# Patient Record
Sex: Female | Born: 2000 | Race: Black or African American | Hispanic: No | Marital: Single
Health system: Southern US, Community
[De-identification: ages and names within clinical notes are randomized; demographics above are authoritative.]

## PROBLEM LIST (undated history)

## (undated) ENCOUNTER — Ambulatory Visit: Admission: EM | Discharge status: Against Medical Advice | Payer: Medicaid Other

## (undated) DIAGNOSIS — A749 Chlamydial infection, unspecified: Secondary | ICD-10-CM

## (undated) DIAGNOSIS — Z789 Other specified health status: Secondary | ICD-10-CM

## (undated) HISTORY — DX: Other specified health status: Z78.9

## (undated) HISTORY — PX: NO PAST SURGERIES: SHX2092

## (undated) HISTORY — PX: SHOULDER SURGERY: SHX246

---
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
## 2019-04-15 ENCOUNTER — Encounter: Payer: Self-pay | Admitting: General Practice

## 2019-07-12 ENCOUNTER — Encounter (HOSPITAL_COMMUNITY): Payer: Self-pay

## 2019-07-12 ENCOUNTER — Emergency Department (HOSPITAL_COMMUNITY): Payer: Medicaid Other

## 2019-07-12 ENCOUNTER — Emergency Department (HOSPITAL_COMMUNITY)
Admission: EM | Admit: 2019-07-12 | Discharge: 2019-07-12 | Disposition: A | Discharge status: Home / Self Care | Payer: Medicaid Other | Attending: Emergency Medicine | Admitting: Emergency Medicine

## 2019-07-12 ENCOUNTER — Other Ambulatory Visit: Payer: Self-pay

## 2019-07-12 DIAGNOSIS — X509XXA Other and unspecified overexertion or strenuous movements or postures, initial encounter: Secondary | ICD-10-CM | POA: Diagnosis not present

## 2019-07-12 DIAGNOSIS — Y929 Unspecified place or not applicable: Secondary | ICD-10-CM | POA: Diagnosis not present

## 2019-07-12 DIAGNOSIS — M25519 Pain in unspecified shoulder: Secondary | ICD-10-CM | POA: Diagnosis not present

## 2019-07-12 DIAGNOSIS — S4991XA Unspecified injury of right shoulder and upper arm, initial encounter: Secondary | ICD-10-CM | POA: Diagnosis present

## 2019-07-12 DIAGNOSIS — S43014A Anterior dislocation of right humerus, initial encounter: Secondary | ICD-10-CM | POA: Diagnosis not present

## 2019-07-12 DIAGNOSIS — R52 Pain, unspecified: Secondary | ICD-10-CM | POA: Diagnosis not present

## 2019-07-12 DIAGNOSIS — Y999 Unspecified external cause status: Secondary | ICD-10-CM | POA: Insufficient documentation

## 2019-07-12 DIAGNOSIS — Y9389 Activity, other specified: Secondary | ICD-10-CM | POA: Insufficient documentation

## 2019-07-12 DIAGNOSIS — S43004A Unspecified dislocation of right shoulder joint, initial encounter: Secondary | ICD-10-CM | POA: Insufficient documentation

## 2019-07-12 MED ORDER — IBUPROFEN 400 MG PO TABS: 400.0000 mg | ORAL_TABLET | Freq: Three times a day (TID) | ORAL | 0 refills | Status: DC

## 2019-07-12 MED ORDER — IBUPROFEN 400 MG PO TABS: 400.0000 mg | ORAL_TABLET | Freq: Four times a day (QID) | ORAL | 0 refills | Status: DC | PRN

## 2019-07-12 MED ORDER — KETAMINE HCL 50 MG/5ML IJ SOSY
0.3000 mg/kg | PREFILLED_SYRINGE | Freq: Once | INTRAMUSCULAR | Status: AC
  Administered 2019-07-12: 17 mg via INTRAVENOUS
  Filled 2019-07-12: qty 5

## 2019-07-12 MED ORDER — KETAMINE HCL 50 MG/5ML IJ SOSY: 1.0000 mg/kg | PREFILLED_SYRINGE | Freq: Once | INTRAMUSCULAR | Status: DC

## 2019-07-12 MED ORDER — ETOMIDATE 2 MG/ML IV SOLN
8.0000 mg | Freq: Once | INTRAVENOUS | Status: AC
  Administered 2019-07-12: 19:00:00 8 mg via INTRAVENOUS
  Filled 2019-07-12: qty 10
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
## 2019-07-12 NOTE — Progress Notes (Signed)
This Probation officer called to bedside for conscious sedation procedure.  Pt tolerated well.  HR108, spo2 100% on room air.  ETCO2=39.  RN remains in room.
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
## 2019-08-18 ENCOUNTER — Emergency Department (HOSPITAL_COMMUNITY): Payer: Medicaid Other

## 2019-08-18 ENCOUNTER — Inpatient Hospital Stay (HOSPITAL_COMMUNITY)
Admission: EM | Admit: 2019-08-18 | Discharge: 2019-08-23 | DRG: 208 | Disposition: A | Discharge status: Home / Self Care | Payer: Medicaid Other | Attending: Surgery | Admitting: Surgery

## 2019-08-18 DIAGNOSIS — D62 Acute posthemorrhagic anemia: Secondary | ICD-10-CM | POA: Diagnosis present

## 2019-08-18 DIAGNOSIS — S21232A Puncture wound without foreign body of left back wall of thorax without penetration into thoracic cavity, initial encounter: Secondary | ICD-10-CM | POA: Diagnosis present

## 2019-08-18 DIAGNOSIS — R52 Pain, unspecified: Secondary | ICD-10-CM | POA: Diagnosis not present

## 2019-08-18 DIAGNOSIS — I472 Ventricular tachycardia: Secondary | ICD-10-CM | POA: Diagnosis present

## 2019-08-18 DIAGNOSIS — J90 Pleural effusion, not elsewhere classified: Secondary | ICD-10-CM | POA: Diagnosis present

## 2019-08-18 DIAGNOSIS — Z20822 Contact with and (suspected) exposure to covid-19: Secondary | ICD-10-CM | POA: Diagnosis present

## 2019-08-18 DIAGNOSIS — S42112B Displaced fracture of body of scapula, left shoulder, initial encounter for open fracture: Secondary | ICD-10-CM | POA: Diagnosis not present

## 2019-08-18 DIAGNOSIS — T859XXD Unspecified complication of internal prosthetic device, implant and graft, subsequent encounter: Secondary | ICD-10-CM

## 2019-08-18 DIAGNOSIS — S272XXA Traumatic hemopneumothorax, initial encounter: Principal | ICD-10-CM | POA: Diagnosis present

## 2019-08-18 DIAGNOSIS — J939 Pneumothorax, unspecified: Secondary | ICD-10-CM

## 2019-08-18 DIAGNOSIS — J9600 Acute respiratory failure, unspecified whether with hypoxia or hypercapnia: Secondary | ICD-10-CM

## 2019-08-18 DIAGNOSIS — S31609A Unspecified open wound of abdominal wall, unspecified quadrant with penetration into peritoneal cavity, initial encounter: Secondary | ICD-10-CM | POA: Diagnosis not present

## 2019-08-18 DIAGNOSIS — S21109A Unspecified open wound of unspecified front wall of thorax without penetration into thoracic cavity, initial encounter: Secondary | ICD-10-CM | POA: Diagnosis not present

## 2019-08-18 DIAGNOSIS — W3400XA Accidental discharge from unspecified firearms or gun, initial encounter: Secondary | ICD-10-CM

## 2019-08-18 DIAGNOSIS — R531 Weakness: Secondary | ICD-10-CM | POA: Diagnosis not present

## 2019-08-18 DIAGNOSIS — S42112A Displaced fracture of body of scapula, left shoulder, initial encounter for closed fracture: Secondary | ICD-10-CM | POA: Diagnosis present

## 2019-08-18 DIAGNOSIS — J9601 Acute respiratory failure with hypoxia: Secondary | ICD-10-CM | POA: Diagnosis present

## 2019-08-18 DIAGNOSIS — J9690 Respiratory failure, unspecified, unspecified whether with hypoxia or hypercapnia: Secondary | ICD-10-CM

## 2019-08-18 DIAGNOSIS — S31109A Unspecified open wound of abdominal wall, unspecified quadrant without penetration into peritoneal cavity, initial encounter: Secondary | ICD-10-CM | POA: Diagnosis not present

## 2019-08-18 DIAGNOSIS — R000 Tachycardia, unspecified: Secondary | ICD-10-CM | POA: Diagnosis not present

## 2019-08-18 DIAGNOSIS — R404 Transient alteration of awareness: Secondary | ICD-10-CM | POA: Diagnosis not present

## 2019-08-18 DIAGNOSIS — S31000A Unspecified open wound of lower back and pelvis without penetration into retroperitoneum, initial encounter: Secondary | ICD-10-CM | POA: Diagnosis not present

## 2019-08-18 DIAGNOSIS — E876 Hypokalemia: Secondary | ICD-10-CM | POA: Diagnosis present

## 2019-08-18 DIAGNOSIS — S2242XA Multiple fractures of ribs, left side, initial encounter for closed fracture: Secondary | ICD-10-CM | POA: Diagnosis not present

## 2019-08-18 DIAGNOSIS — I959 Hypotension, unspecified: Secondary | ICD-10-CM | POA: Diagnosis present

## 2019-08-18 DIAGNOSIS — R110 Nausea: Secondary | ICD-10-CM | POA: Diagnosis not present

## 2019-08-18 DIAGNOSIS — I10 Essential (primary) hypertension: Secondary | ICD-10-CM | POA: Diagnosis not present

## 2019-08-18 DIAGNOSIS — J9382 Other air leak: Secondary | ICD-10-CM | POA: Diagnosis present

## 2019-08-18 DIAGNOSIS — S21132A Puncture wound without foreign body of left front wall of thorax without penetration into thoracic cavity, initial encounter: Secondary | ICD-10-CM | POA: Diagnosis present

## 2019-08-18 DIAGNOSIS — Z23 Encounter for immunization: Secondary | ICD-10-CM

## 2019-08-18 HISTORY — DX: Accidental discharge from unspecified firearms or gun, initial encounter: W34.00XA

## 2019-08-18 LAB — CBC
HCT: 31.9 % — ABNORMAL LOW (ref 36.0–46.0)
Hemoglobin: 9.8 g/dL — ABNORMAL LOW (ref 12.0–15.0)
MCH: 29.9 pg (ref 26.0–34.0)
MCHC: 30.7 g/dL (ref 30.0–36.0)
MCV: 97.3 fL (ref 80.0–100.0)
Platelets: 323 10*3/uL (ref 150–400)
RBC: 3.28 MIL/uL — ABNORMAL LOW (ref 3.87–5.11)
RDW: 12 % (ref 11.5–15.5)
WBC: 7.7 10*3/uL (ref 4.0–10.5)
nRBC: 0 % (ref 0.0–0.2)

## 2019-08-18 LAB — PROTIME-INR
INR: 1.3 — ABNORMAL HIGH (ref 0.8–1.2)
Prothrombin Time: 15.6 seconds — ABNORMAL HIGH (ref 11.4–15.2)

## 2019-08-18 LAB — POC SARS CORONAVIRUS 2 AG -  ED: SARS Coronavirus 2 Ag: NEGATIVE

## 2019-08-18 MED ORDER — FENTANYL CITRATE (PF) 100 MCG/2ML IJ SOLN
INTRAMUSCULAR | Status: AC | PRN
  Administered 2019-08-18: 100 ug via INTRAVENOUS

## 2019-08-18 MED ORDER — MIDAZOLAM HCL 2 MG/2ML IJ SOLN
INTRAMUSCULAR | Status: AC
  Filled 2019-08-18: qty 2

## 2019-08-18 MED ORDER — SODIUM CHLORIDE 0.9 % IV SOLN
INTRAVENOUS | Status: AC | PRN
  Administered 2019-08-18: 1000 mL via INTRAVENOUS

## 2019-08-18 MED ORDER — SUCCINYLCHOLINE CHLORIDE 20 MG/ML IJ SOLN
INTRAMUSCULAR | Status: AC | PRN
  Administered 2019-08-18: 150 mg via INTRAVENOUS

## 2019-08-18 MED ORDER — ETOMIDATE 2 MG/ML IV SOLN
INTRAVENOUS | Status: AC | PRN
  Administered 2019-08-18: 15 mg via INTRAVENOUS

## 2019-08-18 MED ORDER — PROPOFOL 1000 MG/100ML IV EMUL
INTRAVENOUS | Status: AC
  Administered 2019-08-19: 10 ug/kg/min
  Filled 2019-08-18: qty 100

## 2019-08-18 MED ORDER — FENTANYL CITRATE (PF) 100 MCG/2ML IJ SOLN
INTRAMUSCULAR | Status: AC
  Filled 2019-08-18: qty 2

## 2019-08-18 MED ORDER — MIDAZOLAM HCL 5 MG/5ML IJ SOLN
INTRAMUSCULAR | Status: AC | PRN
  Administered 2019-08-18: 2 mg via INTRAVENOUS

## 2019-08-18 NOTE — ED Provider Notes (Signed)
Procedure Name: Intubation Date/Time: 08/18/2019 11:38 PM Performed by: Era Bumpers, MD Pre-anesthesia Checklist: Patient identified, Emergency Drugs available, Suction available, Patient being monitored and Timeout performed Preoxygenation: Pre-oxygenation with 100% oxygen Induction Type: IV induction and Rapid sequence Laryngoscope Size: Mac and 4 Grade View: Grade II Tube size: 7.5 mm Number of attempts: 1 Airway Equipment and Method: Video-laryngoscopy Placement Confirmation: ETT inserted through vocal cords under direct vision,  Breath sounds checked- equal and bilateral and CO2 detector Secured at: 23 cm Tube secured with: ETT holder      Tolerated well with no immediate complications Cxr demonstrates tube in appropriate position   Marytza Grandpre, Lovena Le, MD 08/18/19 0272    Merryl Hacker, MD 08/19/19 [phone]

## 2019-08-18 NOTE — Procedures (Signed)
.  Chest Tube Insertion Procedure Note  Indications:  Clinically significant Hemothorax  Pre-operative Diagnosis: Hemothorax  Post-operative Diagnosis: Hemothorax  Procedure Details  Informed consent was obtained for the procedure, including sedation.  Risks of lung perforation, hemorrhage, arrhythmia, and adverse drug reaction were discussed.   After sterile skin prep, using standard technique, a 20 French tube was placed in the left lateral 5th rib space.  Findings: 100-200 ml blood out initially  Estimated Blood Loss:  200 mL         Specimens:  None              Complications:  None; patient tolerated the procedure well.         Disposition: ICU - intubated and critically ill.         Condition: stable
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
## 2019-08-19 ENCOUNTER — Inpatient Hospital Stay (HOSPITAL_COMMUNITY): Payer: Medicaid Other

## 2019-08-19 ENCOUNTER — Emergency Department (HOSPITAL_COMMUNITY): Payer: Medicaid Other

## 2019-08-19 ENCOUNTER — Encounter (HOSPITAL_COMMUNITY): Payer: Self-pay

## 2019-08-19 DIAGNOSIS — S21402A Unspecified open wound of left back wall of thorax with penetration into thoracic cavity, initial encounter: Secondary | ICD-10-CM | POA: Diagnosis not present

## 2019-08-19 DIAGNOSIS — R110 Nausea: Secondary | ICD-10-CM | POA: Diagnosis not present

## 2019-08-19 DIAGNOSIS — Z20822 Contact with and (suspected) exposure to covid-19: Secondary | ICD-10-CM | POA: Diagnosis present

## 2019-08-19 DIAGNOSIS — S21102A Unspecified open wound of left front wall of thorax without penetration into thoracic cavity, initial encounter: Secondary | ICD-10-CM | POA: Diagnosis not present

## 2019-08-19 DIAGNOSIS — R000 Tachycardia, unspecified: Secondary | ICD-10-CM | POA: Diagnosis not present

## 2019-08-19 DIAGNOSIS — S2242XA Multiple fractures of ribs, left side, initial encounter for closed fracture: Secondary | ICD-10-CM | POA: Diagnosis not present

## 2019-08-19 DIAGNOSIS — W3400XA Accidental discharge from unspecified firearms or gun, initial encounter: Secondary | ICD-10-CM

## 2019-08-19 DIAGNOSIS — S31000A Unspecified open wound of lower back and pelvis without penetration into retroperitoneum, initial encounter: Secondary | ICD-10-CM | POA: Diagnosis not present

## 2019-08-19 DIAGNOSIS — J9601 Acute respiratory failure with hypoxia: Secondary | ICD-10-CM | POA: Diagnosis present

## 2019-08-19 DIAGNOSIS — J9382 Other air leak: Secondary | ICD-10-CM | POA: Diagnosis present

## 2019-08-19 DIAGNOSIS — S31109A Unspecified open wound of abdominal wall, unspecified quadrant without penetration into peritoneal cavity, initial encounter: Secondary | ICD-10-CM | POA: Diagnosis not present

## 2019-08-19 DIAGNOSIS — Z23 Encounter for immunization: Secondary | ICD-10-CM | POA: Diagnosis not present

## 2019-08-19 DIAGNOSIS — J939 Pneumothorax, unspecified: Secondary | ICD-10-CM | POA: Diagnosis not present

## 2019-08-19 DIAGNOSIS — I472 Ventricular tachycardia: Secondary | ICD-10-CM | POA: Diagnosis present

## 2019-08-19 DIAGNOSIS — I959 Hypotension, unspecified: Secondary | ICD-10-CM | POA: Diagnosis present

## 2019-08-19 DIAGNOSIS — E876 Hypokalemia: Secondary | ICD-10-CM | POA: Diagnosis present

## 2019-08-19 DIAGNOSIS — S31609A Unspecified open wound of abdominal wall, unspecified quadrant with penetration into peritoneal cavity, initial encounter: Secondary | ICD-10-CM | POA: Diagnosis not present

## 2019-08-19 DIAGNOSIS — Z4682 Encounter for fitting and adjustment of non-vascular catheter: Secondary | ICD-10-CM | POA: Diagnosis not present

## 2019-08-19 DIAGNOSIS — J90 Pleural effusion, not elsewhere classified: Secondary | ICD-10-CM | POA: Diagnosis present

## 2019-08-19 DIAGNOSIS — S42112B Displaced fracture of body of scapula, left shoulder, initial encounter for open fracture: Secondary | ICD-10-CM | POA: Diagnosis not present

## 2019-08-19 DIAGNOSIS — S42112A Displaced fracture of body of scapula, left shoulder, initial encounter for closed fracture: Secondary | ICD-10-CM | POA: Diagnosis present

## 2019-08-19 DIAGNOSIS — S21132A Puncture wound without foreign body of left front wall of thorax without penetration into thoracic cavity, initial encounter: Secondary | ICD-10-CM | POA: Diagnosis present

## 2019-08-19 DIAGNOSIS — D62 Acute posthemorrhagic anemia: Secondary | ICD-10-CM | POA: Diagnosis not present

## 2019-08-19 DIAGNOSIS — S21109A Unspecified open wound of unspecified front wall of thorax without penetration into thoracic cavity, initial encounter: Secondary | ICD-10-CM | POA: Diagnosis not present

## 2019-08-19 DIAGNOSIS — S21232A Puncture wound without foreign body of left back wall of thorax without penetration into thoracic cavity, initial encounter: Secondary | ICD-10-CM | POA: Diagnosis present

## 2019-08-19 DIAGNOSIS — R2689 Other abnormalities of gait and mobility: Secondary | ICD-10-CM | POA: Diagnosis not present

## 2019-08-19 DIAGNOSIS — J9600 Acute respiratory failure, unspecified whether with hypoxia or hypercapnia: Secondary | ICD-10-CM | POA: Diagnosis not present

## 2019-08-19 DIAGNOSIS — S272XXA Traumatic hemopneumothorax, initial encounter: Secondary | ICD-10-CM | POA: Diagnosis not present

## 2019-08-19 HISTORY — PX: SHOULDER SURGERY: SHX246

## 2019-08-19 LAB — I-STAT CHEM 8, ED
BUN: 9 mg/dL (ref 6–20)
Calcium, Ion: 1.04 mmol/L — ABNORMAL LOW (ref 1.15–1.40)
Chloride: 101 mmol/L (ref 98–111)
Creatinine, Ser: 0.9 mg/dL (ref 0.44–1.00)
Glucose, Bld: 316 mg/dL — ABNORMAL HIGH (ref 70–99)
HCT: 28 % — ABNORMAL LOW (ref 36.0–46.0)
Hemoglobin: 9.5 g/dL — ABNORMAL LOW (ref 12.0–15.0)
Potassium: 2.5 mmol/L — CL (ref 3.5–5.1)
Sodium: 139 mmol/L (ref 135–145)
TCO2: 20 mmol/L — ABNORMAL LOW (ref 22–32)

## 2019-08-19 LAB — PREPARE FRESH FROZEN PLASMA
Unit division: 0
Unit division: 0
Unit division: 0
Unit division: 0
Unit division: 0
Unit division: 0
Unit division: 0
Unit division: 0

## 2019-08-19 LAB — BASIC METABOLIC PANEL
Anion gap: 13 (ref 5–15)
BUN: 7 mg/dL (ref 6–20)
CO2: 20 mmol/L — ABNORMAL LOW (ref 22–32)
Calcium: 8.3 mg/dL — ABNORMAL LOW (ref 8.9–10.3)
Chloride: 110 mmol/L (ref 98–111)
Creatinine, Ser: 0.61 mg/dL (ref 0.44–1.00)
GFR calc Af Amer: >60 mL/min (ref >60)
GFR calc non Af Amer: >60 mL/min (ref >60)
Glucose, Bld: 113 mg/dL — ABNORMAL HIGH (ref 70–99)
Potassium: 3.4 mmol/L — ABNORMAL LOW (ref 3.5–5.1)
Sodium: 143 mmol/L (ref 135–145)

## 2019-08-19 LAB — COMPREHENSIVE METABOLIC PANEL
ALT: 12 U/L (ref 0–44)
AST: 20 U/L (ref 15–41)
Albumin: 3.3 g/dL — ABNORMAL LOW (ref 3.5–5.0)
Alkaline Phosphatase: 39 U/L (ref 38–126)
Anion gap: 15 (ref 5–15)
BUN: 10 mg/dL (ref 6–20)
CO2: 19 mmol/L — ABNORMAL LOW (ref 22–32)
Calcium: 8.1 mg/dL — ABNORMAL LOW (ref 8.9–10.3)
Chloride: 104 mmol/L (ref 98–111)
Creatinine, Ser: 1.02 mg/dL — ABNORMAL HIGH (ref 0.44–1.00)
GFR calc Af Amer: 39 mL/min — ABNORMAL LOW (ref >60)
GFR calc non Af Amer: 33 mL/min — ABNORMAL LOW (ref >60)
Glucose, Bld: 340 mg/dL — ABNORMAL HIGH (ref 70–99)
Potassium: 2.5 mmol/L — CL (ref 3.5–5.1)
Sodium: 138 mmol/L (ref 135–145)
Total Bilirubin: 0.4 mg/dL (ref 0.3–1.2)
Total Protein: 5.3 g/dL — ABNORMAL LOW (ref 6.5–8.1)

## 2019-08-19 LAB — ABO/RH: ABO/RH(D): B POS

## 2019-08-19 LAB — URINALYSIS, ROUTINE W REFLEX MICROSCOPIC
Bacteria, UA: NONE SEEN
Bilirubin Urine: NEGATIVE
Glucose, UA: >=500 mg/dL — AB
Hgb urine dipstick: NEGATIVE
Ketones, ur: NEGATIVE mg/dL
Nitrite: NEGATIVE
Protein, ur: 30 mg/dL — AB
Specific Gravity, Urine: 1.023 (ref 1.005–1.030)
pH: 6 (ref 5.0–8.0)

## 2019-08-19 LAB — POCT I-STAT 7, (LYTES, BLD GAS, ICA,H+H)
Acid-base deficit: 6 mmol/L — ABNORMAL HIGH (ref 0.0–2.0)
Acid-base deficit: 7 mmol/L — ABNORMAL HIGH (ref 0.0–2.0)
Bicarbonate: 18.6 mmol/L — ABNORMAL LOW (ref 20.0–28.0)
Bicarbonate: 19.8 mmol/L — ABNORMAL LOW (ref 20.0–28.0)
Calcium, Ion: 1.08 mmol/L — ABNORMAL LOW (ref 1.15–1.40)
Calcium, Ion: 1.13 mmol/L — ABNORMAL LOW (ref 1.15–1.40)
HCT: 30 % — ABNORMAL LOW (ref 36.0–46.0)
HCT: 36 % (ref 36.0–46.0)
Hemoglobin: 10.2 g/dL — ABNORMAL LOW (ref 12.0–15.0)
Hemoglobin: 12.2 g/dL (ref 12.0–15.0)
O2 Saturation: 100 %
O2 Saturation: 96 %
Patient temperature: 95.5
Patient temperature: 98.6
Potassium: 3.6 mmol/L (ref 3.5–5.1)
Potassium: 3.6 mmol/L (ref 3.5–5.1)
Sodium: 140 mmol/L (ref 135–145)
Sodium: 143 mmol/L (ref 135–145)
TCO2: 20 mmol/L — ABNORMAL LOW (ref 22–32)
TCO2: 21 mmol/L — ABNORMAL LOW (ref 22–32)
pCO2 arterial: 35 mmHg (ref 32.0–48.0)
pCO2 arterial: 42.5 mmHg (ref 32.0–48.0)
pH, Arterial: 7.266 — ABNORMAL LOW (ref 7.350–7.450)
pH, Arterial: 7.335 — ABNORMAL LOW (ref 7.350–7.450)
pO2, Arterial: 408 mmHg — ABNORMAL HIGH (ref 83.0–108.0)
pO2, Arterial: 88 mmHg (ref 83.0–108.0)

## 2019-08-19 LAB — ETHANOL: Alcohol, Ethyl (B): <10 mg/dL (ref <10)

## 2019-08-19 LAB — BPAM FFP
Blood Product Expiration Date: 202012312359
Blood Product Expiration Date: 202101042359
Blood Product Expiration Date: 202101042359
Blood Product Expiration Date: 202101042359
Blood Product Expiration Date: 202101042359
Blood Product Expiration Date: 202101122359
Blood Product Expiration Date: 202101162359
Blood Product Expiration Date: 202101162359
ISSUE DATE / TIME: 202012302316
ISSUE DATE / TIME: 202012302316
ISSUE DATE / TIME: 202012302321
ISSUE DATE / TIME: 202012302321
Unit Type and Rh: 600
Unit Type and Rh: 6200
Unit Type and Rh: 6200
Unit Type and Rh: 6200
Unit Type and Rh: 6200
Unit Type and Rh: 6200
Unit Type and Rh: 6200
Unit Type and Rh: 6200

## 2019-08-19 LAB — CBC
HCT: 42.5 % (ref 36.0–46.0)
Hemoglobin: 14.2 g/dL (ref 12.0–15.0)
MCH: 29.8 pg (ref 26.0–34.0)
MCHC: 33.4 g/dL (ref 30.0–36.0)
MCV: 89.1 fL (ref 80.0–100.0)
Platelets: 273 10*3/uL (ref 150–400)
RBC: 4.77 MIL/uL (ref 3.87–5.11)
RDW: 14.6 % (ref 11.5–15.5)
WBC: 27.2 10*3/uL — ABNORMAL HIGH (ref 4.0–10.5)
nRBC: 0 % (ref 0.0–0.2)

## 2019-08-19 LAB — BPAM PLATELET PHERESIS
Blood Product Expiration Date: 202101012359
ISSUE DATE / TIME: 202012302328
Unit Type and Rh: 6200

## 2019-08-19 LAB — RESPIRATORY PANEL BY RT PCR (FLU A&B, COVID)
Influenza A by PCR: NEGATIVE
Influenza B by PCR: NEGATIVE
SARS Coronavirus 2 by RT PCR: NEGATIVE

## 2019-08-19 LAB — PREPARE PLATELET PHERESIS: Unit division: 0

## 2019-08-19 LAB — LACTIC ACID, PLASMA
Lactic Acid, Venous: 5.2 mmol/L (ref 0.5–1.9)
Lactic Acid, Venous: 8.6 mmol/L (ref 0.5–1.9)

## 2019-08-19 LAB — MAGNESIUM: Magnesium: 2.1 mg/dL (ref 1.7–2.4)

## 2019-08-19 LAB — BLOOD PRODUCT ORDER (VERBAL) VERIFICATION

## 2019-08-19 LAB — MASSIVE TRANSFUSION PROTOCOL ORDER (BLOOD BANK NOTIFICATION)

## 2019-08-19 LAB — TRIGLYCERIDES: Triglycerides: 146 mg/dL (ref <150)

## 2019-08-19 LAB — MRSA PCR SCREENING: MRSA by PCR: NEGATIVE

## 2019-08-19 MED ORDER — DOCUSATE SODIUM 50 MG/5ML PO LIQD: 100.0000 mg | Freq: Two times a day (BID) | ORAL | Status: DC | PRN

## 2019-08-19 MED ORDER — TETANUS-DIPHTH-ACELL PERTUSSIS 5-2.5-18.5 LF-MCG/0.5 IM SUSP
0.5000 mL | Freq: Once | INTRAMUSCULAR | Status: AC
  Administered 2019-08-19: 0.5 mL via INTRAMUSCULAR
  Filled 2019-08-19: qty 0.5

## 2019-08-19 MED ORDER — CHLORHEXIDINE GLUCONATE CLOTH 2 % EX PADS
6.0000 | MEDICATED_PAD | Freq: Every day | CUTANEOUS | Status: DC
  Administered 2019-08-19 – 2019-08-22 (×4): 6 via TOPICAL

## 2019-08-19 MED ORDER — SODIUM CHLORIDE 0.9 % IV SOLN: INTRAVENOUS | Status: DC

## 2019-08-19 MED ORDER — POTASSIUM CHLORIDE 10 MEQ/100ML IV SOLN
10.0000 meq | INTRAVENOUS | Status: AC
  Administered 2019-08-19 (×2): 10 meq via INTRAVENOUS
  Filled 2019-08-19: qty 100

## 2019-08-19 MED ORDER — POTASSIUM CHLORIDE 10 MEQ/100ML IV SOLN
10.0000 meq | INTRAVENOUS | Status: AC
  Administered 2019-08-19 (×3): 10 meq via INTRAVENOUS
  Filled 2019-08-19 (×4): qty 100

## 2019-08-19 MED ORDER — CHLORHEXIDINE GLUCONATE 0.12% ORAL RINSE (MEDLINE KIT)
15.0000 mL | Freq: Two times a day (BID) | OROMUCOSAL | Status: DC
  Administered 2019-08-19 (×3): 15 mL via OROMUCOSAL

## 2019-08-19 MED ORDER — FENTANYL 2500MCG IN NS 250ML (10MCG/ML) PREMIX INFUSION
50.0000 ug/h | INTRAVENOUS | Status: DC
  Administered 2019-08-19: 100 ug/h via INTRAVENOUS
  Filled 2019-08-19 (×2): qty 250

## 2019-08-19 MED ORDER — SODIUM CHLORIDE 0.9 % IV SOLN
INTRAVENOUS | Status: DC | PRN
  Administered 2019-08-19: 250 mL via INTRAVENOUS

## 2019-08-19 MED ORDER — FENTANYL CITRATE (PF) 100 MCG/2ML IJ SOLN
50.0000 ug | INTRAMUSCULAR | Status: DC | PRN
  Administered 2019-08-20 – 2019-08-21 (×9): 50 ug via INTRAVENOUS
  Filled 2019-08-19 (×8): qty 2

## 2019-08-19 MED ORDER — PROPOFOL 1000 MG/100ML IV EMUL
0.0000 ug/kg/min | INTRAVENOUS | Status: DC
  Administered 2019-08-19: 01:00:00 50 ug/kg/min via INTRAVENOUS

## 2019-08-19 MED ORDER — LORAZEPAM 2 MG/ML IJ SOLN
INTRAMUSCULAR | Status: AC
  Filled 2019-08-19: qty 1

## 2019-08-19 MED ORDER — LORAZEPAM 2 MG/ML IJ SOLN
1.0000 mg | INTRAMUSCULAR | Status: DC | PRN
  Administered 2019-08-19: 2 mg via INTRAVENOUS
  Filled 2019-08-19: qty 1

## 2019-08-19 MED ORDER — PROPOFOL 1000 MG/100ML IV EMUL: 0.0000 ug/kg/min | INTRAVENOUS | Status: DC

## 2019-08-19 MED ORDER — FENTANYL CITRATE (PF) 100 MCG/2ML IJ SOLN
50.0000 ug | Freq: Once | INTRAMUSCULAR | Status: AC
  Administered 2019-08-19: 01:00:00 50 ug via INTRAVENOUS
  Filled 2019-08-19: qty 2

## 2019-08-19 MED ORDER — DOCUSATE SODIUM 100 MG PO CAPS
100.0000 mg | ORAL_CAPSULE | Freq: Two times a day (BID) | ORAL | Status: DC
  Administered 2019-08-21 – 2019-08-23 (×4): 100 mg via ORAL
  Filled 2019-08-19 (×4): qty 1

## 2019-08-19 MED ORDER — CEFAZOLIN SODIUM-DEXTROSE 1-4 GM/50ML-% IV SOLN
1.0000 g | Freq: Once | INTRAVENOUS | Status: AC
  Administered 2019-08-19: 1 g via INTRAVENOUS
  Filled 2019-08-19: qty 50

## 2019-08-19 MED ORDER — ACETAMINOPHEN 325 MG PO TABS
650.0000 mg | ORAL_TABLET | ORAL | Status: DC | PRN
  Administered 2019-08-20: 650 mg via ORAL
  Filled 2019-08-19: qty 2

## 2019-08-19 MED ORDER — METOPROLOL TARTRATE 5 MG/5ML IV SOLN
5.0000 mg | Freq: Once | INTRAVENOUS | Status: AC
  Administered 2019-08-19: 5 mg via INTRAVENOUS
  Filled 2019-08-19: qty 5

## 2019-08-19 MED ORDER — METOPROLOL TARTRATE 5 MG/5ML IV SOLN: 5.0000 mg | INTRAVENOUS | Status: DC | PRN

## 2019-08-19 MED ORDER — METOPROLOL TARTRATE 5 MG/5ML IV SOLN
5.0000 mg | Freq: Once | INTRAVENOUS | Status: AC
  Administered 2019-08-19: 10:00:00 5 mg via INTRAVENOUS
  Filled 2019-08-19: qty 5

## 2019-08-19 MED ORDER — ORAL CARE MOUTH RINSE
15.0000 mL | OROMUCOSAL | Status: DC
  Administered 2019-08-19 (×5): 15 mL via OROMUCOSAL

## 2019-08-19 MED ORDER — IOHEXOL 300 MG/ML  SOLN
100.0000 mL | Freq: Once | INTRAMUSCULAR | Status: AC | PRN
  Administered 2019-08-19: 100 mL via INTRAVENOUS

## 2019-08-19 MED ORDER — ORAL CARE MOUTH RINSE
15.0000 mL | Freq: Two times a day (BID) | OROMUCOSAL | Status: DC
  Administered 2019-08-20: 15 mL via OROMUCOSAL

## 2019-08-19 MED ORDER — LACTATED RINGERS IV BOLUS
1000.0000 mL | Freq: Once | INTRAVENOUS | Status: AC
  Administered 2019-08-19: 1000 mL via INTRAVENOUS

## 2019-08-19 MED ORDER — PROPOFOL 1000 MG/100ML IV EMUL
0.0000 ug/kg/min | INTRAVENOUS | Status: DC
  Administered 2019-08-19: 02:00:00 40 ug/kg/min via INTRAVENOUS
  Filled 2019-08-19: qty 100

## 2019-08-19 MED ORDER — FENTANYL BOLUS VIA INFUSION
50.0000 ug | INTRAVENOUS | Status: DC | PRN
  Filled 2019-08-19: qty 50

## 2019-08-19 NOTE — Progress Notes (Signed)
Pt continues to bite ET tube.  RT placed bite block to prevent airflow interruption.  Patient tolerating well at this time.
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
## 2019-08-19 NOTE — ED Notes (Signed)
Patient currently at CT scan .  

## 2019-08-19 NOTE — Progress Notes (Signed)
Left chest decompressive needle d/c per verbal order from Dr. Gurney Maxin Trauma MD

## 2019-08-19 NOTE — Progress Notes (Signed)
Wasted 57mL of Fentanyl with Wyn Quaker, RN  Candy Sledge, RN

## 2019-08-19 NOTE — Progress Notes (Signed)
Inpatient Diabetes Program Recommendations  AACE/ADA: New Consensus Statement on Inpatient Glycemic Control (2015)  Target Ranges:  Prepandial:   less than 140 mg/dL      Peak postprandial:   less than 180 mg/dL (1-2 hours)      Critically ill patients:  140 - 180 mg/dL    Review of Glycemic Control  Results for Katrina Barnett, Katrina Barnett (MRN [id number]) as of 08/19/2019 08:33  Ref. Range 08/18/2019 23:32 08/19/2019 03:05  Glucose Latest Ref Range: 70 - 99 mg/dL 316 (H) 113 (H)   Diabetes history: No hx noted Current orders for Inpatient glycemic control: none  Inpatient Diabetes Program Recommendations:    If CBGs continue to exceed inpatient goals of 180 mg/dL, consider adding Novolog 1-3 units Q4H under ICU protocol.   Thanks, Bronson Curb, MSN, RNC-OB Diabetes Coordinator [phone] (8a-5p)
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
## 2019-08-19 NOTE — Progress Notes (Signed)
Patient had witnessed full body seizure activity that lasted 45 seconds. Patient desatted at this time to 92% and heart rate in the 150s. Dr. Kieth Brightly was notified. Verbal order for PRN ativan for seizure activity. Will continue to monitor

## 2019-08-19 NOTE — Procedures (Signed)
Extubation Procedure Note  Patient Details:   Name: Brantley Naser DOB: 2000/12/27 MRN: [id number]   Airway Documentation:    Vent end date: 08/19/19 Vent end time: 1630   Evaluation  O2 sats: stable throughout Complications: No apparent complications Patient did tolerate procedure well. Bilateral Breath Sounds: Clear   Patient extubated per MD order, placed on 4L [place]. Patient able to speak & cough post extubation.  Kathie Dike 08/19/2019, 4:33 PM

## 2019-08-20 ENCOUNTER — Inpatient Hospital Stay (HOSPITAL_COMMUNITY): Payer: Medicaid Other

## 2019-08-20 LAB — CBC
HCT: 26.1 % — ABNORMAL LOW (ref 36.0–46.0)
Hemoglobin: 9.1 g/dL — ABNORMAL LOW (ref 12.0–15.0)
MCH: 29.8 pg (ref 26.0–34.0)
MCHC: 34.9 g/dL (ref 30.0–36.0)
MCV: 85.6 fL (ref 80.0–100.0)
Platelets: 189 10*3/uL (ref 150–400)
RBC: 3.05 MIL/uL — ABNORMAL LOW (ref 3.87–5.11)
RDW: 14.6 % (ref 11.5–15.5)
WBC: 22.4 10*3/uL — ABNORMAL HIGH (ref 4.0–10.5)
nRBC: 0 % (ref 0.0–0.2)

## 2019-08-20 LAB — BASIC METABOLIC PANEL
Anion gap: 8 (ref 5–15)
BUN: <5 mg/dL — ABNORMAL LOW (ref 6–20)
CO2: 24 mmol/L (ref 22–32)
Calcium: 8.1 mg/dL — ABNORMAL LOW (ref 8.9–10.3)
Chloride: 105 mmol/L (ref 98–111)
Creatinine, Ser: 0.55 mg/dL (ref 0.44–1.00)
GFR calc Af Amer: >60 mL/min (ref >60)
GFR calc non Af Amer: >60 mL/min (ref >60)
Glucose, Bld: 103 mg/dL — ABNORMAL HIGH (ref 70–99)
Potassium: 3.9 mmol/L (ref 3.5–5.1)
Sodium: 137 mmol/L (ref 135–145)

## 2019-08-20 LAB — TRIGLYCERIDES: Triglycerides: 58 mg/dL (ref <150)

## 2019-08-20 LAB — PREGNANCY, URINE: Preg Test, Ur: NEGATIVE

## 2019-08-20 LAB — CDS SEROLOGY

## 2019-08-20 MED ORDER — OXYCODONE HCL 5 MG PO TABS
5.0000 mg | ORAL_TABLET | ORAL | Status: DC | PRN
  Administered 2019-08-20 – 2019-08-23 (×10): 5 mg via ORAL
  Filled 2019-08-20 (×11): qty 1

## 2019-08-20 MED ORDER — ORAL CARE MOUTH RINSE
15.0000 mL | Freq: Two times a day (BID) | OROMUCOSAL | Status: DC
  Administered 2019-08-20 (×2): 15 mL via OROMUCOSAL
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
## 2019-08-21 ENCOUNTER — Inpatient Hospital Stay (HOSPITAL_COMMUNITY): Payer: Medicaid Other

## 2019-08-21 LAB — BASIC METABOLIC PANEL
Anion gap: 11 (ref 5–15)
BUN: 6 mg/dL (ref 6–20)
CO2: 23 mmol/L (ref 22–32)
Calcium: 8.6 mg/dL — ABNORMAL LOW (ref 8.9–10.3)
Chloride: 100 mmol/L (ref 98–111)
Creatinine, Ser: 0.56 mg/dL (ref 0.44–1.00)
GFR calc Af Amer: >60 mL/min (ref >60)
GFR calc non Af Amer: >60 mL/min (ref >60)
Glucose, Bld: 95 mg/dL (ref 70–99)
Potassium: 3.6 mmol/L (ref 3.5–5.1)
Sodium: 134 mmol/L — ABNORMAL LOW (ref 135–145)

## 2019-08-21 LAB — CBC
HCT: 23.2 % — ABNORMAL LOW (ref 36.0–46.0)
Hemoglobin: 8 g/dL — ABNORMAL LOW (ref 12.0–15.0)
MCH: 29.5 pg (ref 26.0–34.0)
MCHC: 34.5 g/dL (ref 30.0–36.0)
MCV: 85.6 fL (ref 80.0–100.0)
Platelets: 196 10*3/uL (ref 150–400)
RBC: 2.71 MIL/uL — ABNORMAL LOW (ref 3.87–5.11)
RDW: 14.2 % (ref 11.5–15.5)
WBC: 18.6 10*3/uL — ABNORMAL HIGH (ref 4.0–10.5)
nRBC: 0 % (ref 0.0–0.2)

## 2019-08-21 LAB — MAGNESIUM: Magnesium: 1.9 mg/dL (ref 1.7–2.4)

## 2019-08-21 MED ORDER — FENTANYL CITRATE (PF) 100 MCG/2ML IJ SOLN
50.0000 ug | INTRAMUSCULAR | Status: DC | PRN
  Administered 2019-08-21 – 2019-08-23 (×11): 50 ug via INTRAVENOUS
  Filled 2019-08-21 (×12): qty 2

## 2019-08-21 MED ORDER — IBUPROFEN 600 MG PO TABS
600.0000 mg | ORAL_TABLET | Freq: Three times a day (TID) | ORAL | Status: DC
  Administered 2019-08-21 – 2019-08-23 (×7): 600 mg via ORAL
  Filled 2019-08-21 (×7): qty 1

## 2019-08-21 MED ORDER — ACETAMINOPHEN 500 MG PO TABS
1000.0000 mg | ORAL_TABLET | Freq: Four times a day (QID) | ORAL | Status: DC
  Administered 2019-08-21 – 2019-08-23 (×9): 1000 mg via ORAL
  Filled 2019-08-21 (×9): qty 2

## 2019-08-21 NOTE — Progress Notes (Signed)
Attempted to educate pt on incentive spirometer use. Pt sleeping comfortably. Will attempt again when pt is awake.

## 2019-08-21 NOTE — Progress Notes (Signed)
Update given to pt's mom, via phone. Discussed importance of pt getting up and out of bed today. Pt's mom verbalized understanding and was in agreement. She will come up to visit today.
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
## 2019-08-21 NOTE — Progress Notes (Signed)
Pt arrived to the floor via a bed, alert and oriented with left upper extremity pain. Pt oriented to room and equipment heart monitor applied, CCMD notified, We'll continue to monitor.

## 2019-08-22 ENCOUNTER — Inpatient Hospital Stay (HOSPITAL_COMMUNITY): Payer: Medicaid Other

## 2019-08-22 LAB — BPAM RBC
Blood Product Expiration Date: 202101062359
Blood Product Expiration Date: 202101062359
Blood Product Expiration Date: 202101152359
Blood Product Expiration Date: 202101152359
Blood Product Expiration Date: 202101152359
Blood Product Expiration Date: 202101162359
Blood Product Expiration Date: 202101172359
Blood Product Expiration Date: 202101172359
Blood Product Expiration Date: 202101172359
Blood Product Expiration Date: 202101172359
Blood Product Expiration Date: 202101182359
Blood Product Expiration Date: 202101272359
Blood Product Expiration Date: 202101282359
ISSUE DATE / TIME: 202012302316
ISSUE DATE / TIME: 202012302316
ISSUE DATE / TIME: 202012302352
ISSUE DATE / TIME: 202012302352
ISSUE DATE / TIME: 202101011418
ISSUE DATE / TIME: 202101020743
ISSUE DATE / TIME: 202101030215
ISSUE DATE / TIME: 202101030215
ISSUE DATE / TIME: 202101030215
Unit Type and Rh: 7300
Unit Type and Rh: 7300
Unit Type and Rh: 9500
Unit Type and Rh: 9500
Unit Type and Rh: 9500
Unit Type and Rh: 9500
Unit Type and Rh: 9500
Unit Type and Rh: 9500
Unit Type and Rh: 9500
Unit Type and Rh: 9500
Unit Type and Rh: 9500
Unit Type and Rh: 9500
Unit Type and Rh: 9500

## 2019-08-22 LAB — CBC
HCT: 22.7 % — ABNORMAL LOW (ref 36.0–46.0)
Hemoglobin: 7.8 g/dL — ABNORMAL LOW (ref 12.0–15.0)
MCH: 29.5 pg (ref 26.0–34.0)
MCHC: 34.4 g/dL (ref 30.0–36.0)
MCV: 86 fL (ref 80.0–100.0)
Platelets: 239 10*3/uL (ref 150–400)
RBC: 2.64 MIL/uL — ABNORMAL LOW (ref 3.87–5.11)
RDW: 14.2 % (ref 11.5–15.5)
WBC: 15.3 10*3/uL — ABNORMAL HIGH (ref 4.0–10.5)
nRBC: 0 % (ref 0.0–0.2)

## 2019-08-22 LAB — TYPE AND SCREEN
ABO/RH(D): B POS
Antibody Screen: NEGATIVE
Unit division: 0
Unit division: 0
Unit division: 0
Unit division: 0
Unit division: 0
Unit division: 0
Unit division: 0
Unit division: 0
Unit division: 0
Unit division: 0
Unit division: 0
Unit division: 0
Unit division: 0

## 2019-08-22 MED ORDER — ONDANSETRON HCL 4 MG/2ML IJ SOLN
4.0000 mg | Freq: Four times a day (QID) | INTRAMUSCULAR | Status: DC | PRN
  Administered 2019-08-22 – 2019-08-23 (×3): 4 mg via INTRAVENOUS
  Filled 2019-08-22 (×3): qty 2

## 2019-08-22 MED ORDER — ENOXAPARIN SODIUM 40 MG/0.4ML ~~LOC~~ SOLN
40.0000 mg | SUBCUTANEOUS | Status: DC
  Administered 2019-08-22 – 2019-08-23 (×2): 40 mg via SUBCUTANEOUS
  Filled 2019-08-22 (×2): qty 0.4

## 2019-08-22 NOTE — Progress Notes (Addendum)
Have offered to walk with pt in hallway. Pt requesting to rest. Educated on importance of getting out of the bed. Will continue to encourage pt to ambulate.
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
## 2019-08-22 NOTE — Progress Notes (Signed)
Pt ambulated 470 ft with rolling walker and standby assist. Pt tolerated well, however heart rate did get up as high as 155. Rate came back down to 120s when pt got back to bed. Pt pulling 750 on her incentive spirometer at this time.
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
## 2019-08-22 NOTE — Progress Notes (Signed)
Pt ambulated 940 ft with rolling walker and standby assist.  Pt tolerated activity well but became a little lightheaded when she got back to her room.  Heart rate went up at least 120s.  Rate went back down to 80s once back in bed.  Will continue to monitor.  Harriet Masson, RN

## 2019-08-23 ENCOUNTER — Other Ambulatory Visit: Payer: Self-pay

## 2019-08-23 ENCOUNTER — Inpatient Hospital Stay (HOSPITAL_COMMUNITY): Payer: Medicaid Other

## 2019-08-23 ENCOUNTER — Encounter (HOSPITAL_COMMUNITY): Payer: Self-pay

## 2019-08-23 LAB — BASIC METABOLIC PANEL
Anion gap: 9 (ref 5–15)
BUN: 9 mg/dL (ref 6–20)
CO2: 27 mmol/L (ref 22–32)
Calcium: 8.6 mg/dL — ABNORMAL LOW (ref 8.9–10.3)
Chloride: 102 mmol/L (ref 98–111)
Creatinine, Ser: 0.48 mg/dL (ref 0.44–1.00)
GFR calc Af Amer: >60 mL/min (ref >60)
GFR calc non Af Amer: >60 mL/min (ref >60)
Glucose, Bld: 93 mg/dL (ref 70–99)
Potassium: 3.8 mmol/L (ref 3.5–5.1)
Sodium: 138 mmol/L (ref 135–145)

## 2019-08-23 LAB — CBC
HCT: 21.7 % — ABNORMAL LOW (ref 36.0–46.0)
Hemoglobin: 7.4 g/dL — ABNORMAL LOW (ref 12.0–15.0)
MCH: 29.7 pg (ref 26.0–34.0)
MCHC: 34.1 g/dL (ref 30.0–36.0)
MCV: 87.1 fL (ref 80.0–100.0)
Platelets: 331 10*3/uL (ref 150–400)
RBC: 2.49 MIL/uL — ABNORMAL LOW (ref 3.87–5.11)
RDW: 14.4 % (ref 11.5–15.5)
WBC: 12.4 10*3/uL — ABNORMAL HIGH (ref 4.0–10.5)
nRBC: 0 % (ref 0.0–0.2)

## 2019-08-23 LAB — MAGNESIUM: Magnesium: 2 mg/dL (ref 1.7–2.4)

## 2019-08-23 LAB — POC SARS CORONAVIRUS 2 AG: SARS Coronavirus 2 Ag: NEGATIVE

## 2019-08-23 LAB — PHOSPHORUS: Phosphorus: 3.1 mg/dL (ref 2.5–4.6)

## 2019-08-23 MED ORDER — ONDANSETRON HCL 4 MG PO TABS: 4.0000 mg | ORAL_TABLET | Freq: Three times a day (TID) | ORAL | 0 refills | Status: DC | PRN

## 2019-08-23 MED ORDER — IBUPROFEN 600 MG PO TABS: 600.0000 mg | ORAL_TABLET | Freq: Three times a day (TID) | ORAL | 0 refills | Status: DC | PRN

## 2019-08-23 MED ORDER — ACETAMINOPHEN 500 MG PO TABS: 1000.0000 mg | ORAL_TABLET | Freq: Four times a day (QID) | ORAL | 0 refills | Status: DC | PRN

## 2019-08-23 MED ORDER — OXYCODONE HCL 5 MG PO TABS: 5.0000 mg | ORAL_TABLET | Freq: Four times a day (QID) | ORAL | 0 refills | Status: DC | PRN

## 2019-08-23 MED ORDER — METHOCARBAMOL 500 MG PO TABS
1000.0000 mg | ORAL_TABLET | Freq: Three times a day (TID) | ORAL | Status: DC
  Administered 2019-08-23 (×2): 1000 mg via ORAL
  Filled 2019-08-23 (×2): qty 2

## 2019-08-23 NOTE — Progress Notes (Signed)
Patient with episode of nausea and vomiting, patient only requesting ice water at this time and declines any medication for nausea. Will monitor patient .Brylie Sneath, Randall An RN
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
## 2019-08-23 NOTE — Discharge Instructions (Signed)
Pneumothorax A pneumothorax is commonly called a collapsed lung. It is a condition in which air leaks from a lung and builds up between the thin layer of tissue that covers the lungs (visceral pleura) and the interior wall of the chest cavity (parietal pleura). The air gets trapped outside the lung, between the lung and the chest wall (pleural space). The air takes up space and prevents the lung from fully expanding. This condition sometimes occurs suddenly with no apparent cause. The buildup of air may be small or large. A small pneumothorax may go away on its own. A large pneumothorax will require treatment and hospitalization. What are the causes? This condition may be caused by:  Trauma and injury to the chest wall.  Surgery and other medical procedures.  A complication of an underlying lung problem, especially chronic obstructive pulmonary disease (COPD) or emphysema. Sometimes the cause of this condition is not known. What increases the risk? You are more likely to develop this condition if:  You have an underlying lung problem.  You smoke.  You are 20-40 years old, female, tall, and underweight.  You have a personal or family history of pneumothorax.  You have an eating disorder (anorexia nervosa). This condition can also happen quickly, even in people with no history of lung problems. What are the signs or symptoms? Sometimes a pneumothorax will have no symptoms. When symptoms are present, they can include:  Chest pain.  Shortness of breath.  Increased rate of breathing.  Bluish color to your lips or skin (cyanosis). How is this diagnosed? This condition may be diagnosed by:  A medical history and physical exam.  A chest X-ray, chest CT scan, or ultrasound. How is this treated? Treatment depends on how severe your condition is. The goal of treatment is to remove the extra air and allow your lung to expand back to its normal size.  For a small pneumothorax: ? No  treatment may be needed. ? Extra oxygen is sometimes used to make it go away more quickly.  For a large pneumothorax or a pneumothorax that is causing symptoms, a procedure is done to drain the air from your lungs. To do this, a health care provider may use: ? A needle with a syringe. This is used to suck air from a pleural space where no additional leakage is taking place. ? A chest tube. This is used to suck air where there is ongoing leakage into the pleural space. The chest tube may need to remain in place for several days until the air leak has healed.  In more severe cases, surgery may be needed to repair the damage that is causing the leak.  If you have multiple pneumothorax episodes or have an air leak that will not heal, a procedure called a pleurodesis may be done. A medicine is placed in the pleural space to irritate the tissues around the lung so that the lung will stick to the chest wall, seal any leaks, and stop any buildup of air in that space. If you have an underlying lung problem, severe symptoms, or a large pneumothorax you will usually need to stay in the hospital. Follow these instructions at home: Lifestyle  Do not use any products that contain nicotine or tobacco, such as cigarettes and e-cigarettes. These are major risk factors in pneumothorax. If you need help quitting, ask your health care provider.  Do not lift anything that is heavier than 10 lb (4.5 kg), or the limit that your health care   provider tells you, until he or she says that it is safe.  Avoid activities that take a lot of effort (strenuous) for as long as told by your health care provider.  Return to your normal activities as told by your health care provider. Ask your health care provider what activities are safe for you.  Do not fly in an airplane or scuba dive until your health care provider says it is okay. General instructions  Take over-the-counter and prescription medicines only as told by your  health care provider.  If a cough or pain makes it difficult for you to sleep at night, try sleeping in a semi-upright position in a recliner or by using 2 or 3 pillows.  If you had a chest tube and it was removed, ask your health care provider when you can remove the bandage (dressing). While the dressing is in place, do not allow it to get wet.  Keep all follow-up visits as told by your health care provider. This is important. Contact a health care provider if:  You cough up thick mucus (sputum) that is yellow or green in color.  You were treated with a chest tube, and you have redness, increasing pain, or discharge at the site where it was placed. Get help right away if:  You have increasing chest pain or shortness of breath.  You have a cough that will not go away.  You begin coughing up blood.  You have pain that is getting worse or is not controlled with medicines.  The site where your chest tube was located opens up.  You feel air coming out of the site where the chest tube was placed.  You have a fever or persistent symptoms for more than 2-3 days.  You have a fever and your symptoms suddenly get worse. These symptoms may represent a serious problem that is an emergency. Do not wait to see if the symptoms will go away. Get medical help right away. Call your local emergency services (911 in the [place] Do not drive yourself to the hospital. Summary  A pneumothorax, commonly called a collapsed lung, is a condition in which air leaks from a lung and gets trapped between the lung and the chest wall (pleural space).  The buildup of air may be small or large. A small pneumothorax may go away on its own. A large pneumothorax will require treatment and hospitalization.  Treatment for this condition depends on how severe the pneumothorax is. The goal of treatment is to remove the extra air and allow the lung to expand back to its normal size. This information is not intended to  replace advice given to you by your health care provider. Make sure you discuss any questions you have with your health care provider. Document Revised: 07/18/2017 Document Reviewed: 07/14/2017 Elsevier Patient Education  [place], Adult Taking care of your wound properly can help to prevent pain, infection, and scarring. It can also help your wound to heal more quickly. How to care for your wound Wound care      Follow instructions from your health care provider about how to take care of your wound. Make sure you: ? Wash your hands with soap and water before you change the bandage (dressing). If soap and water are not available, use hand sanitizer. ? Change your dressing as told by your health care provider. ? Leave stitches (sutures), skin glue, or adhesive strips in place. These skin closures may need  to stay in place for 2 weeks or longer. If adhesive strip edges start to loosen and curl up, you may trim the loose edges. Do not remove adhesive strips completely unless your health care provider tells you to do that.  Check your wound area every day for signs of infection. Check for: ? Redness, swelling, or pain. ? Fluid or blood. ? Warmth. ? Pus or a bad smell.  Ask your health care provider if you should clean the wound with mild soap and water. Doing this may include: ? Using a clean towel to pat the wound dry after cleaning it. Do not rub or scrub the wound. ? Applying a cream or ointment. Do this only as told by your health care provider. ? Covering the incision with a clean dressing.  Ask your health care provider when you can leave the wound uncovered.  Keep the dressing dry until your health care provider says it can be removed. Do not take baths, swim, use a hot tub, or do anything that would put the wound underwater until your health care provider approves. Ask your health care provider if you can take showers. You may only be allowed to take sponge  baths. Medicines   If you were prescribed an antibiotic medicine, cream, or ointment, take or use the antibiotic as told by your health care provider. Do not stop taking or using the antibiotic even if your condition improves.  Take over-the-counter and prescription medicines only as told by your health care provider. If you were prescribed pain medicine, take it 30 or more minutes before you do any wound care or as told by your health care provider. General instructions  Return to your normal activities as told by your health care provider. Ask your health care provider what activities are safe.  Do not scratch or pick at the wound.  Do not use any products that contain nicotine or tobacco, such as cigarettes and e-cigarettes. These may delay wound healing. If you need help quitting, ask your health care provider.  Keep all follow-up visits as told by your health care provider. This is important.  Eat a diet that includes protein, vitamin A, vitamin C, and other nutrient-rich foods to help the wound heal. ? Foods rich in protein include meat, dairy, beans, nuts, and other sources. ? Foods rich in vitamin A include carrots and dark green, leafy vegetables. ? Foods rich in vitamin C include citrus, tomatoes, and other fruits and vegetables. ? Nutrient-rich foods have protein, carbohydrates, fat, vitamins, or minerals. Eat a variety of healthy foods including vegetables, fruits, and whole grains. Contact a health care provider if:  You received a tetanus shot and you have swelling, severe pain, redness, or bleeding at the injection site.  Your pain is not controlled with medicine.  You have redness, swelling, or pain around the wound.  You have fluid or blood coming from the wound.  Your wound feels warm to the touch.  You have pus or a bad smell coming from the wound.  You have a fever or chills.  You are nauseous or you vomit.  You are dizzy. Get help right away if:  You  have a red streak going away from your wound.  The edges of the wound open up and separate.  Your wound is bleeding, and the bleeding does not stop with gentle pressure.  You have a rash.  You faint.  You have trouble breathing. Summary  Always wash your hands with soap  and water before changing your bandage (dressing).  To help with healing, eat foods that are rich in protein, vitamin A, vitamin C, and other nutrients.  Check your wound every day for signs of infection. Contact your health care provider if you suspect that your wound is infected. This information is not intended to replace advice given to you by your health care provider. Make sure you discuss any questions you have with your health care provider. Document Revised: 11/23/2018 Document Reviewed: 02/20/2016 Elsevier Patient Education  2020 [employer].

## 2019-08-23 NOTE — Progress Notes (Signed)
Patient and family given discharge instructions, medication list, and follow up appointments given. Patient verbalized understanding. patient will have rolling walker prior to being discharged. IV and tele were dcd. Will discharge home as ordered. Peggy Loge, Randall An RN

## 2019-08-24 ENCOUNTER — Encounter (HOSPITAL_COMMUNITY): Payer: Self-pay

## 2019-09-08 ENCOUNTER — Ambulatory Visit
Admission: RE | Admit: 2019-09-08 | Discharge: 2019-09-08 | Disposition: A | Discharge status: Home / Self Care | Payer: Medicaid Other | Source: Ambulatory Visit | Attending: General Surgery | Admitting: General Surgery

## 2019-09-08 ENCOUNTER — Other Ambulatory Visit: Payer: Self-pay | Admitting: General Surgery

## 2019-09-08 ENCOUNTER — Other Ambulatory Visit: Payer: Self-pay

## 2019-09-08 DIAGNOSIS — S2190XA Unspecified open wound of unspecified part of thorax, initial encounter: Secondary | ICD-10-CM | POA: Diagnosis not present

## 2019-09-08 DIAGNOSIS — S41002A Unspecified open wound of left shoulder, initial encounter: Secondary | ICD-10-CM | POA: Diagnosis not present

## 2019-09-08 DIAGNOSIS — J942 Hemothorax: Secondary | ICD-10-CM

## 2019-09-09 DIAGNOSIS — J942 Hemothorax: Secondary | ICD-10-CM | POA: Diagnosis not present

## 2019-09-23 DIAGNOSIS — M795 Residual foreign body in soft tissue: Secondary | ICD-10-CM | POA: Diagnosis not present

## 2020-01-19 DIAGNOSIS — Z202 Contact with and (suspected) exposure to infections with a predominantly sexual mode of transmission: Secondary | ICD-10-CM | POA: Diagnosis not present

## 2020-01-19 DIAGNOSIS — Z113 Encounter for screening for infections with a predominantly sexual mode of transmission: Secondary | ICD-10-CM | POA: Diagnosis not present
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
## 2020-03-27 DIAGNOSIS — Z113 Encounter for screening for infections with a predominantly sexual mode of transmission: Secondary | ICD-10-CM | POA: Diagnosis not present

## 2021-10-05 ENCOUNTER — Encounter (HOSPITAL_COMMUNITY): Payer: Self-pay

## 2021-10-05 ENCOUNTER — Other Ambulatory Visit: Payer: Self-pay

## 2021-10-05 ENCOUNTER — Emergency Department (HOSPITAL_COMMUNITY): Payer: Medicaid Other

## 2021-10-05 ENCOUNTER — Emergency Department (HOSPITAL_COMMUNITY)
Admission: EM | Admit: 2021-10-05 | Discharge: 2021-10-05 | Disposition: A | Discharge status: Home / Self Care | Payer: Medicaid Other | Attending: Emergency Medicine | Admitting: Emergency Medicine

## 2021-10-05 DIAGNOSIS — R000 Tachycardia, unspecified: Secondary | ICD-10-CM | POA: Diagnosis not present

## 2021-10-05 DIAGNOSIS — D72829 Elevated white blood cell count, unspecified: Secondary | ICD-10-CM | POA: Insufficient documentation

## 2021-10-05 DIAGNOSIS — E860 Dehydration: Secondary | ICD-10-CM | POA: Insufficient documentation

## 2021-10-05 DIAGNOSIS — R531 Weakness: Secondary | ICD-10-CM | POA: Diagnosis present

## 2021-10-05 DIAGNOSIS — J36 Peritonsillar abscess: Secondary | ICD-10-CM | POA: Insufficient documentation

## 2021-10-05 DIAGNOSIS — R590 Localized enlarged lymph nodes: Secondary | ICD-10-CM | POA: Diagnosis not present

## 2021-10-05 DIAGNOSIS — R1310 Dysphagia, unspecified: Secondary | ICD-10-CM | POA: Diagnosis not present

## 2021-10-05 DIAGNOSIS — R110 Nausea: Secondary | ICD-10-CM | POA: Diagnosis not present

## 2021-10-05 DIAGNOSIS — Z20822 Contact with and (suspected) exposure to covid-19: Secondary | ICD-10-CM | POA: Diagnosis not present

## 2021-10-05 LAB — BASIC METABOLIC PANEL
Anion gap: 13 (ref 5–15)
BUN: 19 mg/dL (ref 6–20)
CO2: 28 mmol/L (ref 22–32)
Calcium: 10.4 mg/dL — ABNORMAL HIGH (ref 8.9–10.3)
Chloride: 97 mmol/L — ABNORMAL LOW (ref 98–111)
Creatinine, Ser: 0.77 mg/dL (ref 0.44–1.00)
GFR, Estimated: >60 mL/min (ref >60)
Glucose, Bld: 109 mg/dL — ABNORMAL HIGH (ref 70–99)
Potassium: 3.4 mmol/L — ABNORMAL LOW (ref 3.5–5.1)
Sodium: 138 mmol/L (ref 135–145)

## 2021-10-05 LAB — GROUP A STREP BY PCR: Group A Strep by PCR: NOT DETECTED

## 2021-10-05 LAB — CBC WITH DIFFERENTIAL/PLATELET
Abs Immature Granulocytes: 0.09 10*3/uL — ABNORMAL HIGH (ref 0.00–0.07)
Basophils Absolute: 0.1 10*3/uL (ref 0.0–0.1)
Basophils Relative: 1 %
Eosinophils Absolute: 0.1 10*3/uL (ref 0.0–0.5)
Eosinophils Relative: 0 %
HCT: 47.6 % — ABNORMAL HIGH (ref 36.0–46.0)
Hemoglobin: 15.8 g/dL — ABNORMAL HIGH (ref 12.0–15.0)
Immature Granulocytes: 1 %
Lymphocytes Relative: 16 %
Lymphs Abs: 2.1 10*3/uL (ref 0.7–4.0)
MCH: 29.5 pg (ref 26.0–34.0)
MCHC: 33.2 g/dL (ref 30.0–36.0)
MCV: 88.8 fL (ref 80.0–100.0)
Monocytes Absolute: 1.3 10*3/uL — ABNORMAL HIGH (ref 0.1–1.0)
Monocytes Relative: 10 %
Neutro Abs: 9.6 10*3/uL — ABNORMAL HIGH (ref 1.7–7.7)
Neutrophils Relative %: 72 %
Platelets: 575 10*3/uL — ABNORMAL HIGH (ref 150–400)
RBC: 5.36 MIL/uL — ABNORMAL HIGH (ref 3.87–5.11)
RDW: 12.4 % (ref 11.5–15.5)
WBC: 13.2 10*3/uL — ABNORMAL HIGH (ref 4.0–10.5)
nRBC: 0 % (ref 0.0–0.2)

## 2021-10-05 LAB — I-STAT BETA HCG BLOOD, ED (MC, WL, AP ONLY): I-stat hCG, quantitative: <5 m[IU]/mL (ref <5)

## 2021-10-05 LAB — RESP PANEL BY RT-PCR (FLU A&B, COVID) ARPGX2
Influenza A by PCR: NEGATIVE
Influenza B by PCR: NEGATIVE
SARS Coronavirus 2 by RT PCR: NEGATIVE

## 2021-10-05 MED ORDER — CLINDAMYCIN PHOSPHATE 600 MG/50ML IV SOLN
600.0000 mg | Freq: Once | INTRAVENOUS | Status: AC
  Administered 2021-10-05: 600 mg via INTRAVENOUS
  Filled 2021-10-05: qty 50

## 2021-10-05 MED ORDER — IOHEXOL 300 MG/ML  SOLN
100.0000 mL | Freq: Once | INTRAMUSCULAR | Status: AC | PRN
  Administered 2021-10-05: 75 mL via INTRAVENOUS

## 2021-10-05 MED ORDER — SODIUM CHLORIDE 0.9 % IV BOLUS
1000.0000 mL | Freq: Once | INTRAVENOUS | Status: AC
  Administered 2021-10-05: 1000 mL via INTRAVENOUS

## 2021-10-05 MED ORDER — DEXAMETHASONE SODIUM PHOSPHATE 10 MG/ML IJ SOLN
10.0000 mg | Freq: Once | INTRAMUSCULAR | Status: AC
  Administered 2021-10-05: 10 mg via INTRAVENOUS
  Filled 2021-10-05: qty 1

## 2021-10-05 MED ORDER — CLINDAMYCIN HCL 300 MG PO CAPS: 300.0000 mg | ORAL_CAPSULE | Freq: Four times a day (QID) | ORAL | 0 refills | Status: DC

## 2021-10-05 MED ORDER — SODIUM CHLORIDE (PF) 0.9 % IJ SOLN
INTRAMUSCULAR | Status: AC
  Filled 2021-10-05: qty 50

## 2021-10-05 MED ORDER — MORPHINE SULFATE (PF) 4 MG/ML IV SOLN
4.0000 mg | Freq: Once | INTRAVENOUS | Status: AC
  Administered 2021-10-05: 4 mg via INTRAVENOUS
  Filled 2021-10-05: qty 1

## 2021-10-05 MED ORDER — ONDANSETRON HCL 4 MG/2ML IJ SOLN
4.0000 mg | Freq: Once | INTRAMUSCULAR | Status: AC
  Administered 2021-10-05: 4 mg via INTRAVENOUS
  Filled 2021-10-05: qty 2

## 2021-10-05 NOTE — ED Notes (Signed)
Pt tolerating PO w/o difficulty stating it is no longer as painful to swallow.

## 2021-10-05 NOTE — ED Provider Notes (Addendum)
Patient care assumed from Fayrene Helper at shift change.  Please see his note for complete history.  In brief, patient is in having a sore throat and emesis for about a week.  Progressively worsening dysphagia.    She had a leukocytosis, CT was ordered medication change.  Physical Exam  BP 115/78    Pulse 68    Temp 98.2 F (36.8 C) (Oral)    Resp 12    Ht 5\' 3"  (1.6 m)    Wt 49.9 kg    LMP 09/20/2021 (Approximate)    SpO2 100%    BMI 19.49 kg/m   Physical Exam Vitals and nursing note reviewed. Exam conducted with a chaperone present.  Constitutional:      General: She is not in acute distress.    Appearance: Normal appearance.  HENT:     Head: Normocephalic and atraumatic.     Mouth/Throat:     Pharynx: Pharyngeal swelling and posterior oropharyngeal erythema present.  Eyes:     General: No scleral icterus.    Extraocular Movements: Extraocular movements intact.     Pupils: Pupils are equal, round, and reactive to light.  Skin:    Coloration: Skin is not jaundiced.  Neurological:     Mental Status: She is alert. Mental status is at baseline.     Coordination: Coordination normal.  This  Procedures  .Critical Care Performed by: 11/18/2021, PA-C Authorized by: Theron Arista, PA-C   Critical care provider statement:    Critical care time (minutes):  30   Critical care start time:  10/05/2021 6:00 PM   Critical care end time:  10/05/2021 6:30 PM   Critical care time was exclusive of:  Separately billable procedures and treating other patients   Critical care was necessary to treat or prevent imminent or life-threatening deterioration of the following conditions:  Respiratory failure   Critical care was time spent personally by me on the following activities:  Development of treatment plan with patient or surrogate, discussions with consultants, evaluation of patient's response to treatment, examination of patient, ordering and review of laboratory studies, ordering and review of  radiographic studies, ordering and performing treatments and interventions, pulse oximetry, re-evaluation of patient's condition and review of old charts  ED Course / MDM    Medical Decision Making Amount and/or Complexity of Data Reviewed Labs: ordered. Radiology: ordered.  Risk Prescription drug management.    CT is notable for 2 cm right peritonsillar abscess.  I reviewed the CT and agree with radiologist dictation.  I also reviewed the labs, they are notable for leukocytosis of 13.2  Spoke with Dr. 10/07/2021 with ENT. Advises Clindamycin and ENT follow up if tolerating PO.  Patient is given fluids, Decadron, morphine and Zofran.  I have ordered IV clindamycin.  She is tolerating oral intake, reports improvement of her dysphagia.    I considered admission for IV antibiotics and observation.  She is tolerating oral intake and her vitals are stable. Observed for multiple hours without signs of impending airway demise.   Patient understands return precautions. Will D/C on Clindamycin as advised by ENT.   Critical interventions -IV antibiotics and steroids for airway management.  Additionally observation as well as specialist consultation.        Pollyann Kennedy, PA-C 10/05/21 1831    10/07/21, PA-C 10/05/21 1832    10/07/21, DO 10/05/21 2211

## 2021-10-05 NOTE — ED Provider Triage Note (Signed)
Emergency Medicine Provider Triage Evaluation Note  Katrina Barnett , a 21 y.o. female  was evaluated in triage.  Pt complains of sore throat starting 8 days ago.  She has been having progressively worsening dysphagia, states she is not even able to drink liquids as of today without spitting up the fluids.  No abdominal pain..  Review of Systems  Positive: SORE THROAT, EMESIS Negative: AP  Physical Exam  BP 126/87 (BP Location: Right Arm)    Pulse (!) 102    Temp 98.2 F (36.8 C) (Oral)    Resp 14    Ht 5\' 3"  (1.6 m)    Wt 49.9 kg    LMP 09/20/2021 (Approximate)    SpO2 99%    BMI 19.49 kg/m  Gen:   Awake, no distress   Resp:  Normal effort  MSK:   Moves extremities without difficulty  Other:  Mildly tachycardic, hard to visualize the posterior oropharynx but uvula does appear slightly deviated to the right.  Medical Decision Making  Medically screening exam initiated at 12:05 PM.  Appropriate orders placed.  Katrina Barnett was informed that the remainder of the evaluation will be completed by another provider, this initial triage assessment does not replace that evaluation, and the importance of remaining in the ED until their evaluation is complete.  We will check labs given 8 days of nausea and reported decreased/nonexistent oral intake.  We will also plan for CT soft tissue to evaluate for abscess    Lynnette Caffey, PA-C 10/05/21 1208

## 2021-10-05 NOTE — ED Triage Notes (Signed)
Patient c/o sore throat and emesis x 8 days. Patient states the sore throat started first.

## 2021-10-05 NOTE — ED Provider Notes (Signed)
[facility]-EMERGENCY DEPT Provider Note   CSN: [id number] Arrival date & time: 10/05/21  1121     History  Chief Complaint  Patient presents with   Sore Throat   Emesis    Katrina Barnett is a 21 y.o. female.  The history is provided by the patient. No language interpreter was used.  Sore Throat  Emesis  21 year old female who present for evaluation of sore throat.  Patient report for the past 8 days she has had persistent and progressive worsening sore throat.  Discomfort is primarily on the right side of his throat, described as moderate soreness, worsening with swallowing or with eating.  She endorsed having to spit up her spit and occasional vomiting from sputum build up.  Pain radiates to her right ear, having subjective fever, and feeling weak and dehydrated.  States she has been eating much for the past several days because of her discomfort.  Several other family members are sick as well.  She denies chest pain or abdominal pain.  Home Medications Prior to Admission medications   Medication Sig Start Date End Date Taking? Authorizing Provider  acetaminophen (TYLENOL) 500 MG tablet Take 2 tablets (1,000 mg total) by mouth every 6 (six) hours as needed. 08/23/19   Barnetta Chapel, PA-C  ibuprofen (ADVIL) 400 MG tablet Take 1 tablet (400 mg total) by mouth every 6 (six) hours as needed. 07/12/19   Derwood Kaplan, MD  ibuprofen (ADVIL) 400 MG tablet Take 1 tablet (400 mg total) by mouth 3 (three) times daily. 07/12/19   Derwood Kaplan, MD  ibuprofen (ADVIL) 600 MG tablet Take 1 tablet (600 mg total) by mouth every 8 (eight) hours as needed. 08/23/19   Barnetta Chapel, PA-C  ondansetron (ZOFRAN) 4 MG tablet Take 1 tablet (4 mg total) by mouth every 8 (eight) hours as needed for nausea or vomiting. 08/23/19   Barnetta Chapel, PA-C  oxyCODONE (OXY IR/ROXICODONE) 5 MG immediate release tablet Take 1 tablet (5 mg total) by mouth every 6 (six) hours as needed for moderate  pain or severe pain. 08/23/19   Barnetta Chapel, PA-C      Allergies    Patient has no known allergies.    Review of Systems   Review of Systems  Gastrointestinal:  Positive for vomiting.  All other systems reviewed and are negative.  Physical Exam Updated Vital Signs BP (!) 144/76    Pulse (!) 108    Temp 98.2 F (36.8 C) (Oral)    Resp 20    Ht 5\' 3"  (1.6 m)    Wt 49.9 kg    LMP 09/20/2021 (Approximate)    SpO2 99%    BMI 19.49 kg/m  Physical Exam Vitals and nursing note reviewed.  Constitutional:      General: She is not in acute distress.    Appearance: She is well-developed.  HENT:     Head: Normocephalic and atraumatic.     Right Ear: Tympanic membrane normal.     Left Ear: Tympanic membrane normal.     Nose: No congestion or rhinorrhea.     Mouth/Throat:     Mouth: Mucous membranes are moist.     Pharynx: Uvula midline. Pharyngeal swelling, posterior oropharyngeal erythema and uvula swelling present.     Tonsils: 1+ on the right. 1+ on the left.     Comments: Uvula midline, fullness noted to right peritonsillar region with bilateral tonsillar enlargement but no exudates, no trismus.  Mouth is dry Eyes:  Conjunctiva/sclera: Conjunctivae normal.  Cardiovascular:     Rate and Rhythm: Tachycardia present.     Heart sounds: Normal heart sounds.  Pulmonary:     Effort: Pulmonary effort is normal.  Abdominal:     Palpations: Abdomen is soft.     Tenderness: There is no abdominal tenderness.  Musculoskeletal:     Cervical back: Neck supple.  Lymphadenopathy:     Cervical: Cervical adenopathy present.  Skin:    Findings: No rash.  Neurological:     Mental Status: She is alert.  Psychiatric:        Mood and Affect: Mood normal.    ED Results / Procedures / Treatments   Labs (all labs ordered are listed, but only abnormal results are displayed) Labs Reviewed  BASIC METABOLIC PANEL - Abnormal; Notable for the following components:      Result Value   Potassium  3.4 (*)    Chloride 97 (*)    Glucose, Bld 109 (*)    Calcium 10.4 (*)    All other components within normal limits  CBC WITH DIFFERENTIAL/PLATELET - Abnormal; Notable for the following components:   WBC 13.2 (*)    RBC 5.36 (*)    Hemoglobin 15.8 (*)    HCT 47.6 (*)    Platelets 575 (*)    Neutro Abs 9.6 (*)    Monocytes Absolute 1.3 (*)    Abs Immature Granulocytes 0.09 (*)    All other components within normal limits  GROUP A STREP BY PCR  RESP PANEL BY RT-PCR (FLU A&B, COVID) ARPGX2  MONONUCLEOSIS SCREEN  I-STAT BETA HCG BLOOD, ED ([facility], [facility], [facility] ONLY)    EKG None  Radiology No results found.  Procedures Procedures    Medications Ordered in ED Medications  iohexol (OMNIPAQUE) 300 MG/ML solution 100 mL (has no administration in time range)  sodium chloride (PF) 0.9 % injection (has no administration in time range)  sodium chloride 0.9 % bolus 1,000 mL (1,000 mLs Intravenous New Bag/Given 10/05/21 1440)  morphine (PF) 4 MG/ML injection 4 mg (4 mg Intravenous Given 10/05/21 1443)  dexamethasone (DECADRON) injection 10 mg (10 mg Intravenous Given 10/05/21 1443)  ondansetron (ZOFRAN) injection 4 mg (4 mg Intravenous Given 10/05/21 1443)    ED Course/ Medical Decision Making/ A&P                           Medical Decision Making  BP (!) 144/76    Pulse (!) 108    Temp 98.2 F (36.8 C) (Oral)    Resp 20    Ht 5\' 3"  (1.6 m)    Wt 49.9 kg    LMP 09/20/2021 (Approximate)    SpO2 99%    BMI 19.49 kg/m   2:21 PM Patient here with persistent right-sided throat irritation and pain now with swallowing.  Also endorsed having difficulty swallowing secondary to pain and discomfort.  States she have not been eating much for the past 8 days.  Patient is nontoxic in appearance, normal phonation, throat exam revealed fullness noted to the right peritonsillar region but uvula is midline and no tonsillar exudates.  Palpation of the neck reveals multiple reactive lymph nodes on the anterior  cervical chain but trachea midline and no evidence of Ludwigs angina  2:56 PM Pt sign out to oncoming team who will f/u on CT result, reassess pt and perform PO challenge if CT without concerning finding.    This patient presents  to the ED for concern of sore throat, this involves an extensive number of treatment options, and is a complaint that carries with it a high risk of complications and morbidity.  The differential diagnosis includes peritonsillar abscess, retropharyngeal abscess, strep, mono, viral URI, Ludwig's angina, Lemierre's disease, epiglottitis, chlamydial pharyngitis   Lab Tests:  I Ordered, and personally interpreted labs.  The pertinent results include:  WBC 13.2.  Negative strep, flu, or COVID   Cardiac Monitoring:  The patient was maintained on a cardiac monitor.  I personally viewed and interpreted the cardiac monitored which showed an underlying rhythm of: sinus tachycardia  Medicines ordered and prescription drug management:  I ordered medication including morphine  for pain control Reevaluation of the patient after these medicines showed that the patient improved I have reviewed the patients home medicines and have made adjustments as needed          Final Clinical Impression(s) / ED Diagnoses Final diagnoses:  None    Rx / DC Orders ED Discharge Orders     None         Domenic Moras, PA-C 10/05/21 [place]    Tegeler, Gwenyth Allegra, MD 10/11/21 [phone]

## 2021-10-05 NOTE — Discharge Instructions (Addendum)
Take Clindamycin every 6 hours for the next 10 days.  Follow up with ENT if not improving on Monday - call the office for follow up appointment.  Return to ED if unable to swallow or worsening symptoms.

## 2021-12-31 ENCOUNTER — Ambulatory Visit
Admission: EM | Admit: 2021-12-31 | Discharge: 2021-12-31 | Disposition: A | Discharge status: Home / Self Care | Payer: Medicaid Other | Attending: Emergency Medicine | Admitting: Emergency Medicine

## 2021-12-31 DIAGNOSIS — N898 Other specified noninflammatory disorders of vagina: Secondary | ICD-10-CM | POA: Diagnosis not present

## 2021-12-31 DIAGNOSIS — N760 Acute vaginitis: Secondary | ICD-10-CM | POA: Insufficient documentation

## 2021-12-31 DIAGNOSIS — Z3202 Encounter for pregnancy test, result negative: Secondary | ICD-10-CM | POA: Diagnosis not present

## 2021-12-31 LAB — POCT URINE PREGNANCY: Preg Test, Ur: NEGATIVE

## 2021-12-31 MED ORDER — FLUCONAZOLE 150 MG PO TABS: ORAL_TABLET | ORAL | 0 refills | Status: DC

## 2021-12-31 NOTE — ED Triage Notes (Signed)
Pt c/o white and increased discharge that she didn't notice until last week. ?
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
## 2021-12-31 NOTE — Discharge Instructions (Addendum)
Based on the history that you provided to me today, you were treated empirically for vaginal candidiasis with fluconazole (Diflucan), take the first tablet today and take the second tablet three days after the first tablet.  Please abstain from sexual intercourse, tampon use while being treated.   Your urine pregnancy test today is negative.   If you have not had complete resolution of your symptoms after completing treatment, please return for repeat evaluation.   Thank you for visiting urgent care today.  I appreciate the opportunity to participate in your care.  

## 2022-01-01 ENCOUNTER — Telehealth (HOSPITAL_COMMUNITY): Payer: Self-pay | Admitting: Emergency Medicine

## 2022-01-01 LAB — CERVICOVAGINAL ANCILLARY ONLY
Bacterial Vaginitis (gardnerella): POSITIVE — AB
Candida Glabrata: NEGATIVE
Candida Vaginitis: POSITIVE — AB
Chlamydia: NEGATIVE
Comment: NEGATIVE
Comment: NEGATIVE
Comment: NEGATIVE
Comment: NEGATIVE
Comment: NEGATIVE
Comment: NORMAL
Neisseria Gonorrhea: NEGATIVE
Trichomonas: NEGATIVE

## 2022-01-01 MED ORDER — METRONIDAZOLE 0.75 % VA GEL: 1.0000 | Freq: Every day | VAGINAL | 0 refills | Status: AC

## 2022-04-15 ENCOUNTER — Inpatient Hospital Stay (HOSPITAL_COMMUNITY)
Admission: AD | Admit: 2022-04-15 | Discharge: 2022-04-15 | Disposition: A | Discharge status: Home / Self Care | Payer: Medicaid Other | Attending: Family Medicine | Admitting: Family Medicine

## 2022-04-15 ENCOUNTER — Inpatient Hospital Stay (HOSPITAL_COMMUNITY): Payer: Medicaid Other

## 2022-04-15 ENCOUNTER — Encounter (HOSPITAL_COMMUNITY): Payer: Self-pay | Admitting: *Deleted

## 2022-04-15 DIAGNOSIS — O210 Mild hyperemesis gravidarum: Secondary | ICD-10-CM | POA: Diagnosis not present

## 2022-04-15 DIAGNOSIS — O26891 Other specified pregnancy related conditions, first trimester: Secondary | ICD-10-CM | POA: Diagnosis not present

## 2022-04-15 DIAGNOSIS — R102 Pelvic and perineal pain: Secondary | ICD-10-CM | POA: Diagnosis not present

## 2022-04-15 DIAGNOSIS — O219 Vomiting of pregnancy, unspecified: Secondary | ICD-10-CM | POA: Diagnosis not present

## 2022-04-15 DIAGNOSIS — O26892 Other specified pregnancy related conditions, second trimester: Secondary | ICD-10-CM | POA: Insufficient documentation

## 2022-04-15 DIAGNOSIS — Z3A14 14 weeks gestation of pregnancy: Secondary | ICD-10-CM | POA: Diagnosis not present

## 2022-04-15 HISTORY — DX: Chlamydial infection, unspecified: A74.9

## 2022-04-15 LAB — CBC
HCT: 34.9 % — ABNORMAL LOW (ref 36.0–46.0)
Hemoglobin: 12.4 g/dL (ref 12.0–15.0)
MCH: 31.3 pg (ref 26.0–34.0)
MCHC: 35.5 g/dL (ref 30.0–36.0)
MCV: 88.1 fL (ref 80.0–100.0)
Platelets: 321 10*3/uL (ref 150–400)
RBC: 3.96 MIL/uL (ref 3.87–5.11)
RDW: 12 % (ref 11.5–15.5)
WBC: 8.1 10*3/uL (ref 4.0–10.5)
nRBC: 0 % (ref 0.0–0.2)

## 2022-04-15 LAB — WET PREP, GENITAL
Sperm: NONE SEEN
Trich, Wet Prep: NONE SEEN
WBC, Wet Prep HPF POC: >=10 — AB (ref <10)
Yeast Wet Prep HPF POC: NONE SEEN

## 2022-04-15 LAB — URINALYSIS, ROUTINE W REFLEX MICROSCOPIC
Bilirubin Urine: NEGATIVE
Glucose, UA: NEGATIVE mg/dL
Hgb urine dipstick: NEGATIVE
Ketones, ur: NEGATIVE mg/dL
Nitrite: NEGATIVE
Protein, ur: NEGATIVE mg/dL
Specific Gravity, Urine: 1.02 (ref 1.005–1.030)
pH: 6 (ref 5.0–8.0)

## 2022-04-15 LAB — POCT PREGNANCY, URINE: Preg Test, Ur: POSITIVE — AB

## 2022-04-15 MED ORDER — PRENATAL VITAMIN 27-0.8 MG PO TABS
1.0000 | ORAL_TABLET | Freq: Every day | ORAL | 11 refills | Status: DC
Start: 2022-04-15 — End: 2023-12-31

## 2022-04-15 NOTE — MAU Note (Signed)
Self swabbed
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
## 2022-04-15 NOTE — MAU Note (Signed)
Katrina Barnett is a 21 y.o. at Unknown here in MAU reporting: having cramps- more on the left, started yesterday. No bleeding. +HPT 2 wks ago.  LMP:  May 17, neg test 5/15 Onset of complaint: yesterday Pain score: 6  off and on Vitals:   04/15/22 1213  BP: (!) 108/56  Pulse: 76  Resp: 16  Temp: 98.2 F (36.8 C)  SpO2: 100%     FHT:[id number] Lab orders placed from triage:  UPT/UA

## 2022-04-15 NOTE — Discharge Instructions (Signed)
[facility] Prenatal Care Providers          [facility] locations:  Hours may vary. Please call for an appointment  [facility] at [facility]             [address], [place], [place] [address] [phone]  [facility] at [facility]                                                             [address], [address], [place], [place], [address] [phone]  [facility] at [place]                                    [address], [address], [place], [place], [address] [phone]  [facility] at [place] [address], [address], [place], [place], [address] [phone]  [facility] at [place]                                 [address], [place], [place], [address] [phone]  [facility] at [facility]                                    [address], , [place], [address] [phone]  [facility] at [facility], [address], [place], , [address]                                                   Safe Medications in Pregnancy    Acne: Benzoyl Peroxide Salicylic Acid  Backache/Headache: Tylenol: 2 regular strength every 4 hours OR              2 Extra strength every 6 hours  Colds/Coughs/Allergies: Benadryl (alcohol free) 25 mg every 6 hours as needed Breath right strips Claritin Cepacol throat lozenges Chloraseptic throat spray Cold-Eeze- up to three times per day Cough drops, alcohol free Flonase (by prescription only) Guaifenesin Mucinex Robitussin DM (plain only, alcohol free) Saline nasal spray/drops Sudafed (pseudoephedrine) & Actifed ** use only after [redacted] weeks gestation and if you do not have high blood pressure Tylenol Vicks Vaporub Zinc lozenges Zyrtec   Constipation: Colace Ducolax suppositories Fleet enema Glycerin  suppositories Metamucil Milk of magnesia Miralax Senokot Smooth move tea  Diarrhea: Kaopectate Imodium A-D  *NO pepto Bismol  Hemorrhoids: Anusol Anusol HC Preparation H Tucks  Indigestion: Tums Maalox Mylanta Zantac  Pepcid  Insomnia: Benadryl (alcohol free) 25mg every 6 hours as needed Tylenol PM Unisom, no Gelcaps  Leg Cramps: Tums MagGel  Nausea/Vomiting:  Bonine Dramamine Emetrol Ginger extract Sea bands Meclizine  Nausea medication to take during pregnancy:  Unisom (doxylamine succinate 25 mg tablets) Take one tablet daily at bedtime. If symptoms are not adequately controlled, the dose can be increased to a maximum recommended dose of two   tablets daily (1/2 tablet in the morning, 1/2 tablet mid-afternoon and one at bedtime). Vitamin B6 100mg tablets. Take one tablet twice a day (up to 200 mg per day).  Skin Rashes: Aveeno products Benadryl cream or 25mg every 6 hours as needed Calamine Lotion 1% cortisone cream  Yeast infection: Gyne-lotrimin 7 Monistat 7   **If taking multiple medications, please check labels to avoid duplicating the same active ingredients **take medication as directed on the label ** Do not exceed 4000 mg of tylenol in 24 hours **Do not take medications that contain aspirin or ibuprofen    

## 2022-04-16 LAB — GC/CHLAMYDIA PROBE AMP (~~LOC~~) NOT AT ARMC
Chlamydia: NEGATIVE
Comment: NEGATIVE
Comment: NORMAL
Neisseria Gonorrhea: NEGATIVE

## 2022-05-22 ENCOUNTER — Telehealth (INDEPENDENT_AMBULATORY_CARE_PROVIDER_SITE_OTHER): Payer: Medicaid Other

## 2022-05-22 DIAGNOSIS — Z3689 Encounter for other specified antenatal screening: Secondary | ICD-10-CM

## 2022-05-22 DIAGNOSIS — Z3480 Encounter for supervision of other normal pregnancy, unspecified trimester: Secondary | ICD-10-CM | POA: Insufficient documentation

## 2022-05-22 DIAGNOSIS — Z8619 Personal history of other infectious and parasitic diseases: Secondary | ICD-10-CM | POA: Insufficient documentation

## 2022-05-22 DIAGNOSIS — O0993 Supervision of high risk pregnancy, unspecified, third trimester: Secondary | ICD-10-CM | POA: Insufficient documentation

## 2022-05-22 HISTORY — DX: Personal history of other infectious and parasitic diseases: Z86.19

## 2022-05-22 MED ORDER — GOJJI WEIGHT SCALE MISC: 1.0000 | 0 refills | Status: DC | PRN

## 2022-05-22 MED ORDER — BLOOD PRESSURE KIT DEVI: 1.0000 | 0 refills | Status: DC | PRN
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
## 2022-06-06 ENCOUNTER — Ambulatory Visit: Payer: Medicaid Other | Attending: Maternal & Fetal Medicine | Admitting: Maternal & Fetal Medicine

## 2022-06-06 ENCOUNTER — Ambulatory Visit: Payer: Medicaid Other

## 2022-06-06 ENCOUNTER — Other Ambulatory Visit: Payer: Self-pay

## 2022-06-06 ENCOUNTER — Other Ambulatory Visit: Payer: Self-pay | Admitting: *Deleted

## 2022-06-06 ENCOUNTER — Ambulatory Visit: Payer: Medicaid Other | Admitting: *Deleted

## 2022-06-06 VITALS — BP 105/62 | HR 73

## 2022-06-06 DIAGNOSIS — O0932 Supervision of pregnancy with insufficient antenatal care, second trimester: Secondary | ICD-10-CM | POA: Insufficient documentation

## 2022-06-06 DIAGNOSIS — Z3480 Encounter for supervision of other normal pregnancy, unspecified trimester: Secondary | ICD-10-CM | POA: Diagnosis not present

## 2022-06-06 DIAGNOSIS — Z3A22 22 weeks gestation of pregnancy: Secondary | ICD-10-CM | POA: Insufficient documentation

## 2022-06-06 DIAGNOSIS — O26892 Other specified pregnancy related conditions, second trimester: Secondary | ICD-10-CM | POA: Diagnosis not present

## 2022-06-06 DIAGNOSIS — O4692 Antepartum hemorrhage, unspecified, second trimester: Secondary | ICD-10-CM | POA: Diagnosis not present

## 2022-06-06 DIAGNOSIS — O09292 Supervision of pregnancy with other poor reproductive or obstetric history, second trimester: Secondary | ICD-10-CM | POA: Insufficient documentation

## 2022-06-06 DIAGNOSIS — O3432 Maternal care for cervical incompetence, second trimester: Secondary | ICD-10-CM | POA: Diagnosis not present

## 2022-06-06 DIAGNOSIS — O26879 Cervical shortening, unspecified trimester: Secondary | ICD-10-CM | POA: Diagnosis not present

## 2022-06-06 DIAGNOSIS — Z8619 Personal history of other infectious and parasitic diseases: Secondary | ICD-10-CM

## 2022-06-06 DIAGNOSIS — O09212 Supervision of pregnancy with history of pre-term labor, second trimester: Secondary | ICD-10-CM | POA: Diagnosis not present

## 2022-06-06 DIAGNOSIS — O26872 Cervical shortening, second trimester: Secondary | ICD-10-CM

## 2022-06-06 MED ORDER — PROGESTERONE 200 MG PO CAPS: 200.0000 mg | ORAL_CAPSULE | Freq: Every day | ORAL | 0 refills | Status: DC
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
## 2022-06-12 ENCOUNTER — Ambulatory Visit: Payer: Medicaid Other | Attending: Maternal & Fetal Medicine

## 2022-06-12 ENCOUNTER — Ambulatory Visit: Payer: Medicaid Other

## 2022-07-03 ENCOUNTER — Ambulatory Visit (INDEPENDENT_AMBULATORY_CARE_PROVIDER_SITE_OTHER): Payer: Medicaid Other | Admitting: Family Medicine

## 2022-07-03 ENCOUNTER — Other Ambulatory Visit (HOSPITAL_COMMUNITY)
Admission: RE | Admit: 2022-07-03 | Discharge: 2022-07-03 | Disposition: A | Discharge status: Home / Self Care | Payer: Medicaid Other | Source: Ambulatory Visit

## 2022-07-03 ENCOUNTER — Encounter: Payer: Self-pay | Admitting: Family Medicine

## 2022-07-03 VITALS — BP 116/69 | HR 88 | Wt 125.7 lb

## 2022-07-03 DIAGNOSIS — Z1332 Encounter for screening for maternal depression: Secondary | ICD-10-CM

## 2022-07-03 DIAGNOSIS — Z3A26 26 weeks gestation of pregnancy: Secondary | ICD-10-CM

## 2022-07-03 DIAGNOSIS — W3400XA Accidental discharge from unspecified firearms or gun, initial encounter: Secondary | ICD-10-CM | POA: Diagnosis not present

## 2022-07-03 DIAGNOSIS — Z3480 Encounter for supervision of other normal pregnancy, unspecified trimester: Secondary | ICD-10-CM | POA: Insufficient documentation

## 2022-07-03 DIAGNOSIS — Z8751 Personal history of pre-term labor: Secondary | ICD-10-CM | POA: Diagnosis not present

## 2022-07-03 DIAGNOSIS — O26879 Cervical shortening, unspecified trimester: Secondary | ICD-10-CM | POA: Insufficient documentation

## 2022-07-03 DIAGNOSIS — Z8619 Personal history of other infectious and parasitic diseases: Secondary | ICD-10-CM
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
## 2022-07-03 NOTE — Patient Instructions (Signed)

## 2022-07-04 ENCOUNTER — Encounter: Payer: Self-pay | Admitting: *Deleted

## 2022-07-04 ENCOUNTER — Ambulatory Visit: Payer: Medicaid Other

## 2022-07-04 ENCOUNTER — Ambulatory Visit: Payer: Medicaid Other | Attending: Maternal & Fetal Medicine

## 2022-07-04 LAB — GC/CHLAMYDIA PROBE AMP (~~LOC~~) NOT AT ARMC
Chlamydia: NEGATIVE
Comment: NEGATIVE
Comment: NORMAL
Neisseria Gonorrhea: NEGATIVE

## 2022-07-04 LAB — CBC/D/PLT+RPR+RH+ABO+RUBIGG...
Antibody Screen: NEGATIVE
Basophils Absolute: 0 10*3/uL (ref 0.0–0.2)
Basos: 0 %
EOS (ABSOLUTE): 0.1 10*3/uL (ref 0.0–0.4)
Eos: 1 %
HCV Ab: NONREACTIVE
HIV Screen 4th Generation wRfx: NONREACTIVE
Hematocrit: 34.3 % (ref 34.0–46.6)
Hemoglobin: 11.8 g/dL (ref 11.1–15.9)
Hepatitis B Surface Ag: NEGATIVE
Immature Grans (Abs): 0 10*3/uL (ref 0.0–0.1)
Immature Granulocytes: 0 %
Lymphocytes Absolute: 1.6 10*3/uL (ref 0.7–3.1)
Lymphs: 20 %
MCH: 31.3 pg (ref 26.6–33.0)
MCHC: 34.4 g/dL (ref 31.5–35.7)
MCV: 91 fL (ref 79–97)
Monocytes Absolute: 0.6 10*3/uL (ref 0.1–0.9)
Monocytes: 8 %
Neutrophils Absolute: 5.6 10*3/uL (ref 1.4–7.0)
Neutrophils: 71 %
Platelets: 327 10*3/uL (ref 150–450)
RBC: 3.77 x10E6/uL (ref 3.77–5.28)
RDW: 12.1 % (ref 11.7–15.4)
RPR Ser Ql: NONREACTIVE
Rh Factor: POSITIVE
Rubella Antibodies, IGG: 1.52 index (ref >0.99)
WBC: 7.9 10*3/uL (ref 3.4–10.8)

## 2022-07-04 LAB — HCV INTERPRETATION

## 2022-07-05 LAB — URINE CULTURE, OB REFLEX

## 2022-07-05 LAB — CULTURE, OB URINE

## 2022-07-15 ENCOUNTER — Other Ambulatory Visit: Payer: Self-pay

## 2022-07-15 DIAGNOSIS — Z3480 Encounter for supervision of other normal pregnancy, unspecified trimester: Secondary | ICD-10-CM

## 2022-07-15 NOTE — Addendum Note (Signed)
Addended by: Nira Retort D on: 07/15/2022 11:31 AM   Modules accepted: Orders
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
## 2022-07-19 ENCOUNTER — Other Ambulatory Visit: Payer: Self-pay | Admitting: Obstetrics and Gynecology

## 2022-07-19 ENCOUNTER — Other Ambulatory Visit: Payer: Medicaid Other

## 2022-07-19 ENCOUNTER — Ambulatory Visit (INDEPENDENT_AMBULATORY_CARE_PROVIDER_SITE_OTHER): Payer: Medicaid Other | Admitting: Obstetrics and Gynecology

## 2022-07-19 VITALS — BP 119/72 | HR 83 | Wt 127.7 lb

## 2022-07-19 DIAGNOSIS — Z8751 Personal history of pre-term labor: Secondary | ICD-10-CM

## 2022-07-19 DIAGNOSIS — O26849 Uterine size-date discrepancy, unspecified trimester: Secondary | ICD-10-CM | POA: Insufficient documentation

## 2022-07-19 DIAGNOSIS — O26879 Cervical shortening, unspecified trimester: Secondary | ICD-10-CM

## 2022-07-19 DIAGNOSIS — Z3480 Encounter for supervision of other normal pregnancy, unspecified trimester: Secondary | ICD-10-CM

## 2022-07-19 DIAGNOSIS — O26843 Uterine size-date discrepancy, third trimester: Secondary | ICD-10-CM

## 2022-07-19 DIAGNOSIS — O219 Vomiting of pregnancy, unspecified: Secondary | ICD-10-CM

## 2022-07-19 DIAGNOSIS — O365990 Maternal care for other known or suspected poor fetal growth, unspecified trimester, not applicable or unspecified: Secondary | ICD-10-CM | POA: Insufficient documentation

## 2022-07-19 DIAGNOSIS — O0993 Supervision of high risk pregnancy, unspecified, third trimester: Secondary | ICD-10-CM

## 2022-07-19 MED ORDER — FAMOTIDINE 20 MG PO TABS: 20.0000 mg | ORAL_TABLET | Freq: Two times a day (BID) | ORAL | 1 refills | Status: DC

## 2022-07-19 MED ORDER — DOXYLAMINE SUCCINATE (SLEEP) 25 MG PO TABS: 25.0000 mg | ORAL_TABLET | Freq: Every day | ORAL | 1 refills | Status: DC

## 2022-07-19 MED ORDER — VITAMIN B-6 25 MG PO TABS: 25.0000 mg | ORAL_TABLET | Freq: Three times a day (TID) | ORAL | 1 refills | Status: DC

## 2022-07-19 NOTE — Patient Instructions (Addendum)
For your nausea - B6 three times per day - Unisom (doxylamine) at night  For heartburn - Pepcid twice a day - Tums as needed  If there are any issues with your prescriptions, please send me a MyChart message. You will see your ultrasound appointment in MyChart - Dr. Berton Lan

## 2022-07-20 LAB — CBC
Hematocrit: 33.7 % — ABNORMAL LOW (ref 34.0–46.6)
Hemoglobin: 11.4 g/dL (ref 11.1–15.9)
MCH: 31.1 pg (ref 26.6–33.0)
MCHC: 33.8 g/dL (ref 31.5–35.7)
MCV: 92 fL (ref 79–97)
Platelets: 270 10*3/uL (ref 150–450)
RBC: 3.67 x10E6/uL — ABNORMAL LOW (ref 3.77–5.28)
RDW: 12.1 % (ref 11.7–15.4)
WBC: 8.7 10*3/uL (ref 3.4–10.8)

## 2022-07-20 LAB — RPR: RPR Ser Ql: NONREACTIVE

## 2022-07-20 LAB — HIV ANTIBODY (ROUTINE TESTING W REFLEX): HIV Screen 4th Generation wRfx: NONREACTIVE

## 2022-07-20 LAB — GLUCOSE TOLERANCE, 2 HOURS W/ 1HR
Glucose, 1 hour: 111 mg/dL (ref 70–179)
Glucose, 2 hour: 64 mg/dL — ABNORMAL LOW (ref 70–152)
Glucose, Fasting: 80 mg/dL (ref 70–91)

## 2022-07-22 NOTE — Telephone Encounter (Signed)
Please run RX under Dr. Catalina Antigua, lead provider/ attending [facility]

## 2022-08-02 ENCOUNTER — Ambulatory Visit: Payer: Medicaid Other

## 2022-08-02 ENCOUNTER — Encounter: Payer: Self-pay | Admitting: Medical

## 2022-08-02 ENCOUNTER — Ambulatory Visit (INDEPENDENT_AMBULATORY_CARE_PROVIDER_SITE_OTHER): Payer: Medicaid Other | Admitting: Medical

## 2022-08-02 ENCOUNTER — Other Ambulatory Visit: Payer: Self-pay

## 2022-08-02 VITALS — BP 116/68 | HR 115 | Wt 126.3 lb

## 2022-08-02 DIAGNOSIS — Z3480 Encounter for supervision of other normal pregnancy, unspecified trimester: Secondary | ICD-10-CM

## 2022-08-02 DIAGNOSIS — O26879 Cervical shortening, unspecified trimester: Secondary | ICD-10-CM

## 2022-08-02 DIAGNOSIS — Z8751 Personal history of pre-term labor: Secondary | ICD-10-CM

## 2022-08-02 DIAGNOSIS — O26843 Uterine size-date discrepancy, third trimester: Secondary | ICD-10-CM

## 2022-08-02 DIAGNOSIS — Z3A30 30 weeks gestation of pregnancy: Secondary | ICD-10-CM
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
## 2022-08-02 NOTE — Patient Instructions (Signed)

## 2022-08-02 NOTE — Progress Notes (Signed)
Called Pt to make aware that her [place] has been rescheduled for 08/09/22 @ 10:30a, no answer. Left VM.

## 2022-08-02 NOTE — Progress Notes (Signed)
Pt has a cough & its causing a lot of abdomen pain. Pt also states that she did not know about the U/S appt that was scheduled for today.

## 2022-08-08 ENCOUNTER — Encounter: Payer: Self-pay | Admitting: Family Medicine

## 2022-08-09 ENCOUNTER — Ambulatory Visit: Payer: Medicaid Other | Attending: Obstetrics and Gynecology | Admitting: Obstetrics and Gynecology

## 2022-08-09 ENCOUNTER — Ambulatory Visit (HOSPITAL_BASED_OUTPATIENT_CLINIC_OR_DEPARTMENT_OTHER): Payer: Medicaid Other

## 2022-08-09 ENCOUNTER — Ambulatory Visit: Payer: Medicaid Other | Attending: Obstetrics and Gynecology | Admitting: *Deleted

## 2022-08-09 ENCOUNTER — Other Ambulatory Visit: Payer: Self-pay | Admitting: *Deleted

## 2022-08-09 ENCOUNTER — Ambulatory Visit (HOSPITAL_BASED_OUTPATIENT_CLINIC_OR_DEPARTMENT_OTHER): Payer: Medicaid Other | Admitting: *Deleted

## 2022-08-09 ENCOUNTER — Other Ambulatory Visit: Payer: Self-pay | Admitting: Obstetrics and Gynecology

## 2022-08-09 ENCOUNTER — Encounter: Payer: Self-pay | Admitting: Obstetrics and Gynecology

## 2022-08-09 VITALS — BP 100/56 | HR 83

## 2022-08-09 DIAGNOSIS — O365930 Maternal care for other known or suspected poor fetal growth, third trimester, not applicable or unspecified: Secondary | ICD-10-CM | POA: Diagnosis not present

## 2022-08-09 DIAGNOSIS — O365931 Maternal care for other known or suspected poor fetal growth, third trimester, fetus 1: Secondary | ICD-10-CM

## 2022-08-09 DIAGNOSIS — O26843 Uterine size-date discrepancy, third trimester: Secondary | ICD-10-CM | POA: Diagnosis not present

## 2022-08-09 DIAGNOSIS — O26873 Cervical shortening, third trimester: Secondary | ICD-10-CM

## 2022-08-09 DIAGNOSIS — O0933 Supervision of pregnancy with insufficient antenatal care, third trimester: Secondary | ICD-10-CM

## 2022-08-09 DIAGNOSIS — O09213 Supervision of pregnancy with history of pre-term labor, third trimester: Secondary | ICD-10-CM

## 2022-08-09 DIAGNOSIS — O09293 Supervision of pregnancy with other poor reproductive or obstetric history, third trimester: Secondary | ICD-10-CM | POA: Insufficient documentation

## 2022-08-09 DIAGNOSIS — O26879 Cervical shortening, unspecified trimester: Secondary | ICD-10-CM

## 2022-08-09 DIAGNOSIS — Z3A31 31 weeks gestation of pregnancy: Secondary | ICD-10-CM

## 2022-08-09 DIAGNOSIS — O09899 Supervision of other high risk pregnancies, unspecified trimester: Secondary | ICD-10-CM

## 2022-08-09 DIAGNOSIS — O26872 Cervical shortening, second trimester: Secondary | ICD-10-CM

## 2022-08-09 NOTE — Progress Notes (Signed)
Maternal-Fetal Medicine   Name: [name] DOB: April 18, 2001 MRN: [id number] Referring Provider: Vonzella Nipple, PA-C  I had the pleasure of seeing Ms. Katrina Barnett today at [facility]. She is G1 P0 at 31w 2d gestation and returned for fetal growth assessment because of uterine-size-date discrepancy on clinical examination at your office. Her pregnancy is dated by LMP date (regular periods).  On ultrasound performed at [redacted] weeks gestation, cervical shortening was seen (1.9 cm).  Patient was counseled and she takes vaginal progesterone daily.  She does not have symptoms of preterm labor.  Patient does not have gestational diabetes.  She had opted not to screen for fetal aneuploidies.  She reports no chronic medical conditions including hypertension.  Obstetrical history significant for a preterm vaginal delivery at [redacted] weeks gestation.  Her son is in good health.  Ultrasound On today's ultrasound, the estimated fetal weight and the abdominal circumference are at the 1st percentiles.  Amniotic fluid is normal and good fetal activity seen.  Umbilical artery Doppler showed normal forward diastolic flow.  NST is reactive.  Fetal growth restriction I explained the finding of fetal growth restriction that is difficult to differentiate from a constitutionally small for gestational age fetus. I discussed the possible causes of fetal growth restriction including placental insufficiency (most common cause), chromosomal anomalies and fetal infections.  Patient did not give history of fever or rashes. I discussed amniocentesis for a definitive result on the fetal karyotype and some genetic conditions (Microarray).  Amniocentesis carries a small risk of preterm delivery (1 in 500 procedures).  Patient will consider cell-free fetal DNA screening.  Timing of delivery: We will discuss timing of delivery at next growth assessment. If severe fetal growth restriction persists, delivery at 37  weeks' gestation will be recommended.  Recommendations -Continue weekly BPP, NST and UA Doppler studies. -Cell-free fetal DNA screening at your office if patient decides to have one. -Continue vaginal progesterone till 36 weeks' gestation.  Thank you for consultation.  If you have any questions or concerns, please contact me the [facility].  Consultation including face-to-face (more than 50%) counseling 30 minutes.

## 2022-08-09 NOTE — Procedures (Signed)
Katrina Barnett 2001-08-11 [redacted]w[redacted]d  Fetus A Non-Stress Test Interpretation for 08/09/22  Indication: IUGR  Fetal Heart Rate A Mode: External Baseline Rate (A): 150 bpm Variability: Minimal, Moderate Accelerations: 10 x 10 Decelerations: Variable Multiple birth?: No  Uterine Activity Mode: Palpation, Toco Contraction Frequency (min): ui Resting Time: Adequate  Interpretation (Fetal Testing) Nonstress Test Interpretation: Reactive Overall Impression: Reassuring for gestational age Comments: Dr. Judeth Cornfield reviewd tracing

## 2022-08-14 ENCOUNTER — Other Ambulatory Visit: Payer: Self-pay | Admitting: Maternal & Fetal Medicine

## 2022-08-14 DIAGNOSIS — O26872 Cervical shortening, second trimester: Secondary | ICD-10-CM

## 2022-08-15 ENCOUNTER — Ambulatory Visit: Payer: Medicaid Other | Admitting: *Deleted

## 2022-08-15 ENCOUNTER — Ambulatory Visit: Payer: Medicaid Other | Attending: Obstetrics and Gynecology | Admitting: *Deleted

## 2022-08-15 VITALS — BP 95/56 | HR 71

## 2022-08-15 DIAGNOSIS — Z3A32 32 weeks gestation of pregnancy: Secondary | ICD-10-CM | POA: Diagnosis not present

## 2022-08-15 DIAGNOSIS — O365931 Maternal care for other known or suspected poor fetal growth, third trimester, fetus 1: Secondary | ICD-10-CM | POA: Diagnosis not present

## 2022-08-15 DIAGNOSIS — O365930 Maternal care for other known or suspected poor fetal growth, third trimester, not applicable or unspecified: Secondary | ICD-10-CM

## 2022-08-15 NOTE — Procedures (Signed)
Katrina Barnett 2001/04/21 [redacted]w[redacted]d  Fetus A Non-Stress Test Interpretation for 08/15/22  Indication: IUGR  Fetal Heart Rate A Mode: External Baseline Rate (A): 140 bpm Variability: Moderate Accelerations: 15 x 15 Decelerations: Variable Multiple birth?: No  Uterine Activity Mode: Toco Contraction Frequency (min): none Resting Tone Palpated: Relaxed  Interpretation (Fetal Testing) Nonstress Test Interpretation: Reactive Overall Impression: Reassuring for gestational age Comments: tracing reviewed by Dr. Grace Bushy

## 2022-08-16 ENCOUNTER — Other Ambulatory Visit: Payer: Self-pay

## 2022-08-16 ENCOUNTER — Ambulatory Visit (INDEPENDENT_AMBULATORY_CARE_PROVIDER_SITE_OTHER): Payer: Medicaid Other | Admitting: Obstetrics and Gynecology

## 2022-08-16 VITALS — BP 102/65 | HR 85 | Wt 129.6 lb

## 2022-08-16 DIAGNOSIS — O26879 Cervical shortening, unspecified trimester: Secondary | ICD-10-CM

## 2022-08-16 DIAGNOSIS — O365930 Maternal care for other known or suspected poor fetal growth, third trimester, not applicable or unspecified: Secondary | ICD-10-CM

## 2022-08-16 DIAGNOSIS — Z8619 Personal history of other infectious and parasitic diseases: Secondary | ICD-10-CM

## 2022-08-16 DIAGNOSIS — Z3A32 32 weeks gestation of pregnancy: Secondary | ICD-10-CM

## 2022-08-16 DIAGNOSIS — Z3480 Encounter for supervision of other normal pregnancy, unspecified trimester: Secondary | ICD-10-CM

## 2022-08-16 DIAGNOSIS — O26873 Cervical shortening, third trimester: Secondary | ICD-10-CM

## 2022-08-16 DIAGNOSIS — O365990 Maternal care for other known or suspected poor fetal growth, unspecified trimester, not applicable or unspecified: Secondary | ICD-10-CM

## 2022-08-16 DIAGNOSIS — Z8751 Personal history of pre-term labor: Secondary | ICD-10-CM
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
## 2022-08-19 NOTE — L&D Delivery Note (Signed)
OB/GYN Faculty Practice Delivery Note  Katrina Barnett is a 22 y.o. [id number] s/p SVD at [redacted]w[redacted]d. She was admitted for SOL.   ROM: 2h 26m with clear fluid GBS Status:  Negative/-- (01/12 1118) Maximum Maternal Temperature: No data recorded.    Labor Progress: Patient arrived at 10 cm dilation and progressed spontaneously   Delivery Date/Time: 09/17/2022 at 0350 Delivery: Called to room and patient was complete and pushing. Head delivered in LOA position. No nuchal cord present. Shoulder and body delivered in usual fashion. Infant with spontaneous cry, placed on mother's abdomen, dried and stimulated. Cord clamped x 2 after 1-minute delay, and cut by FOB. Cord blood drawn. Placenta delivered spontaneously with gentle cord traction. Fundus firm with massage and Pitocin. Labia, perineum, vagina, and cervix inspected with no lacerations.   Placenta: Spontaneous, intact, 3 vessel cord  Complications: None Lacerations: None EBL: 137 mL Analgesia: None   Infant: APGAR (1 MIN): 9   APGAR (5 MINS): 9   APGAR (10 MINS):    Weight: 2050 g  Gifford Shave, MD  OB Fellow

## 2022-08-23 ENCOUNTER — Ambulatory Visit: Payer: Medicaid Other | Admitting: *Deleted

## 2022-08-23 ENCOUNTER — Other Ambulatory Visit: Payer: Self-pay | Admitting: *Deleted

## 2022-08-23 ENCOUNTER — Ambulatory Visit: Payer: Medicaid Other | Attending: Obstetrics and Gynecology

## 2022-08-23 VITALS — BP 108/56 | HR 72

## 2022-08-23 DIAGNOSIS — O365930 Maternal care for other known or suspected poor fetal growth, third trimester, not applicable or unspecified: Secondary | ICD-10-CM

## 2022-08-23 DIAGNOSIS — O09213 Supervision of pregnancy with history of pre-term labor, third trimester: Secondary | ICD-10-CM | POA: Diagnosis not present

## 2022-08-23 DIAGNOSIS — O26873 Cervical shortening, third trimester: Secondary | ICD-10-CM | POA: Diagnosis not present

## 2022-08-23 DIAGNOSIS — Z3A33 33 weeks gestation of pregnancy: Secondary | ICD-10-CM | POA: Diagnosis not present

## 2022-08-23 DIAGNOSIS — O0933 Supervision of pregnancy with insufficient antenatal care, third trimester: Secondary | ICD-10-CM

## 2022-08-23 DIAGNOSIS — O09899 Supervision of other high risk pregnancies, unspecified trimester: Secondary | ICD-10-CM | POA: Diagnosis not present

## 2022-08-23 DIAGNOSIS — O26843 Uterine size-date discrepancy, third trimester: Secondary | ICD-10-CM | POA: Diagnosis not present

## 2022-08-23 NOTE — Procedures (Signed)
Katrina Barnett 2001-06-14 [redacted]w[redacted]d  Fetus A Non-Stress Test Interpretation for 08/23/22  Indication: IUGR  Fetal Heart Rate A Mode: External Baseline Rate (A): 135 bpm Variability: Minimal, Moderate Accelerations: 15 x 15 Decelerations: Prolonged Multiple birth?: No  Uterine Activity Mode: Palpation, Toco Contraction Frequency (min): occ with ui Contraction Duration (sec): 70-120 Contraction Quality: Mild Resting Time: Adequate  Interpretation (Fetal Testing) Nonstress Test Interpretation: Reactive Overall Impression: Reassuring for gestational age Comments: Dr. Annamaria Boots reviewed tracing

## 2022-08-26 ENCOUNTER — Ambulatory Visit: Payer: Medicaid Other | Attending: Obstetrics | Admitting: *Deleted

## 2022-08-26 ENCOUNTER — Ambulatory Visit: Payer: Medicaid Other | Admitting: *Deleted

## 2022-08-26 VITALS — BP 106/53 | HR 80

## 2022-08-26 DIAGNOSIS — O365930 Maternal care for other known or suspected poor fetal growth, third trimester, not applicable or unspecified: Secondary | ICD-10-CM

## 2022-08-26 DIAGNOSIS — Z3A33 33 weeks gestation of pregnancy: Secondary | ICD-10-CM | POA: Diagnosis not present

## 2022-08-26 DIAGNOSIS — Z3480 Encounter for supervision of other normal pregnancy, unspecified trimester: Secondary | ICD-10-CM

## 2022-08-26 DIAGNOSIS — Z8619 Personal history of other infectious and parasitic diseases: Secondary | ICD-10-CM

## 2022-08-26 NOTE — Procedures (Signed)
Katrina Barnett 05/13/2001 [redacted]w[redacted]d  Fetus A Non-Stress Test Interpretation for 08/26/22  Indication: IUGR  Fetal Heart Rate A Mode: External Baseline Rate (A): 135 bpm Variability: Moderate Accelerations: 15 x 15 Decelerations: None Multiple birth?: No  Uterine Activity Mode: Palpation, Toco Contraction Frequency (min): UI Contraction Quality: Mild  Interpretation (Fetal Testing) Nonstress Test Interpretation: Reactive Comments: Dr. Epimenio Sarin reviewed tracing.

## 2022-08-29 ENCOUNTER — Ambulatory Visit: Payer: Medicaid Other

## 2022-08-30 ENCOUNTER — Other Ambulatory Visit: Payer: Medicaid Other

## 2022-08-30 ENCOUNTER — Ambulatory Visit: Payer: Medicaid Other

## 2022-08-30 ENCOUNTER — Encounter: Payer: Self-pay | Admitting: Obstetrics and Gynecology

## 2022-08-30 ENCOUNTER — Other Ambulatory Visit: Payer: Self-pay | Admitting: *Deleted

## 2022-08-30 ENCOUNTER — Other Ambulatory Visit (HOSPITAL_COMMUNITY)
Admission: RE | Admit: 2022-08-30 | Discharge: 2022-08-30 | Disposition: A | Discharge status: Home / Self Care | Payer: Medicaid Other | Source: Ambulatory Visit | Attending: Obstetrics and Gynecology | Admitting: Obstetrics and Gynecology

## 2022-08-30 ENCOUNTER — Ambulatory Visit (INDEPENDENT_AMBULATORY_CARE_PROVIDER_SITE_OTHER): Payer: Medicaid Other | Admitting: Obstetrics and Gynecology

## 2022-08-30 ENCOUNTER — Ambulatory Visit: Payer: Medicaid Other | Admitting: *Deleted

## 2022-08-30 ENCOUNTER — Ambulatory Visit: Payer: Medicaid Other | Attending: Maternal & Fetal Medicine

## 2022-08-30 VITALS — BP 97/59 | HR 75

## 2022-08-30 VITALS — BP 104/80 | HR 71 | Wt 134.4 lb

## 2022-08-30 DIAGNOSIS — O0993 Supervision of high risk pregnancy, unspecified, third trimester: Secondary | ICD-10-CM | POA: Diagnosis not present

## 2022-08-30 DIAGNOSIS — O0933 Supervision of pregnancy with insufficient antenatal care, third trimester: Secondary | ICD-10-CM

## 2022-08-30 DIAGNOSIS — O365930 Maternal care for other known or suspected poor fetal growth, third trimester, not applicable or unspecified: Secondary | ICD-10-CM | POA: Diagnosis not present

## 2022-08-30 DIAGNOSIS — O365990 Maternal care for other known or suspected poor fetal growth, unspecified trimester, not applicable or unspecified: Secondary | ICD-10-CM

## 2022-08-30 DIAGNOSIS — O26843 Uterine size-date discrepancy, third trimester: Secondary | ICD-10-CM | POA: Insufficient documentation

## 2022-08-30 DIAGNOSIS — O09213 Supervision of pregnancy with history of pre-term labor, third trimester: Secondary | ICD-10-CM | POA: Diagnosis not present

## 2022-08-30 DIAGNOSIS — O09899 Supervision of other high risk pregnancies, unspecified trimester: Secondary | ICD-10-CM | POA: Diagnosis not present

## 2022-08-30 DIAGNOSIS — O26873 Cervical shortening, third trimester: Secondary | ICD-10-CM

## 2022-08-30 DIAGNOSIS — O09219 Supervision of pregnancy with history of pre-term labor, unspecified trimester: Secondary | ICD-10-CM | POA: Insufficient documentation

## 2022-08-30 DIAGNOSIS — Z3493 Encounter for supervision of normal pregnancy, unspecified, third trimester: Secondary | ICD-10-CM | POA: Diagnosis not present

## 2022-08-30 DIAGNOSIS — Z3A34 34 weeks gestation of pregnancy: Secondary | ICD-10-CM | POA: Diagnosis not present

## 2022-08-30 DIAGNOSIS — O26879 Cervical shortening, unspecified trimester: Secondary | ICD-10-CM

## 2022-08-30 DIAGNOSIS — Z8751 Personal history of pre-term labor: Secondary | ICD-10-CM

## 2022-08-30 DIAGNOSIS — O26872 Cervical shortening, second trimester: Secondary | ICD-10-CM | POA: Insufficient documentation

## 2022-08-30 NOTE — Procedures (Signed)
Katrina Barnett [birth year] [redacted]w[redacted]d  Fetus A Non-Stress Test Interpretation for 08/30/22  Indication: IUGR  Fetal Heart Rate A Mode: External Baseline Rate (A): 135 bpm Variability: Moderate Accelerations: 15 x 15 Decelerations: None Multiple birth?: No  Uterine Activity Mode: Toco Contraction Frequency (min): one u/c with ui Contraction Duration (sec): 180 Contraction Quality: Mild Resting Tone Palpated: Relaxed Resting Time: Adequate  Interpretation (Fetal Testing) Nonstress Test Interpretation: Reactive Overall Impression: Reassuring for gestational age Comments: Tracing reviewed byDr. Donalee Citrin

## 2022-08-30 NOTE — Progress Notes (Signed)
   PRENATAL VISIT NOTE  Subjective:  Katrina Barnett is a 22 y.o. [id number] at [redacted]w[redacted]d being seen today for ongoing prenatal care.  She is currently monitored for the following issues for this high-risk pregnancy and has History of preterm delivery; Supervision of high risk pregnancy in third trimester; Short cervix affecting pregnancy; and Fetal growth restriction antepartum on their problem list.  Patient reports no complaints.  Contractions: Irritability. Vag. Bleeding: None.  Movement: Present. Denies leaking of fluid.   The following portions of the patient's history were reviewed and updated as appropriate: allergies, current medications, past family history, past medical history, past social history, past surgical history and problem list.   Objective:   Vitals:   08/30/22 1056  BP: 104/80  Pulse: 71  Weight: 134 lb 6.4 oz (61 kg)    Fetal Status: Fetal Heart Rate (bpm): 147   Movement: Present     General:  Alert, oriented and cooperative. Patient is in no acute distress.  Skin: Skin is warm and dry. No rash noted.   Cardiovascular: Normal heart rate noted  Respiratory: Normal respiratory effort, no problems with respiration noted  Abdomen: Soft, gravid, appropriate for gestational age.  Pain/Pressure: Absent     Pelvic: Cervical exam deferred        Extremities: Normal range of motion.  Edema: Trace  Mental Status: Normal mood and affect. Normal behavior. Normal judgment and thought content.   Assessment and Plan:  Pregnancy: G3P0111 at [redacted]w[redacted]d 1. [redacted] weeks gestation of pregnancy - Culture, beta strep (group b only) - GC/Chlamydia probe amp ([place])not at [facility]  2. Fetal growth restriction antepartum Still FGR, a little better efw: 3%, 1820g, afi 13, nl ua Pt set up for IOL on 2/4 at 2345 per patient request Continue with weekly dopplers  3. History of preterm delivery Continue vag progesterone  4. Short cervix affecting pregnancy  Preterm labor symptoms and general  obstetric precautions including but not limited to vaginal bleeding, contractions, leaking of fluid and fetal movement were reviewed in detail with the patient. Please refer to After Visit Summary for other counseling recommendations.   No follow-ups on file.  Future Appointments  Date Time Provider [place]  09/05/2022 12:30 PM [facility] NURSE [facility]  09/05/2022 12:45 PM [facility] US5 [facility]  09/05/2022  2:15 PM [facility] NST [facility]  09/11/2022 12:30 PM [facility] NURSE [facility]  09/11/2022 12:45 PM [facility] US5 [facility]  09/11/2022  2:15 PM [facility] NST [facility]  09/13/2022 11:15 AM Chancy Milroy, MD [facility]  09/18/2022 12:30 PM [facility] NURSE [facility]  09/18/2022 12:45 PM [facility] US5 [facility]  09/18/2022  2:15 PM [facility] NST [facility]  09/20/2022 10:15 AM Griffin Basil, MD [facility]  09/27/2022 10:55 AM Griffin Basil, MD [facility]  10/04/2022 10:55 AM Radene Gunning, MD [facility]  10/14/2022 10:15 AM Radene Gunning, MD [facility]  10/14/2022 11:15 AM [facility] NST [facility]    Aletha Halim, MD

## 2022-09-01 LAB — GC/CHLAMYDIA PROBE AMP (~~LOC~~) NOT AT ARMC
Chlamydia: NEGATIVE
Comment: NEGATIVE
Comment: NORMAL
Neisseria Gonorrhea: NEGATIVE

## 2022-09-03 LAB — CULTURE, BETA STREP (GROUP B ONLY): Strep Gp B Culture: NEGATIVE

## 2022-09-05 ENCOUNTER — Ambulatory Visit: Payer: Medicaid Other | Admitting: *Deleted

## 2022-09-05 ENCOUNTER — Ambulatory Visit: Payer: Medicaid Other | Attending: Obstetrics and Gynecology

## 2022-09-05 ENCOUNTER — Ambulatory Visit: Payer: Medicaid Other

## 2022-09-05 VITALS — BP 123/59 | HR 98

## 2022-09-05 DIAGNOSIS — O09213 Supervision of pregnancy with history of pre-term labor, third trimester: Secondary | ICD-10-CM

## 2022-09-05 DIAGNOSIS — O0993 Supervision of high risk pregnancy, unspecified, third trimester: Secondary | ICD-10-CM

## 2022-09-05 DIAGNOSIS — O0933 Supervision of pregnancy with insufficient antenatal care, third trimester: Secondary | ICD-10-CM | POA: Diagnosis not present

## 2022-09-05 DIAGNOSIS — O09899 Supervision of other high risk pregnancies, unspecified trimester: Secondary | ICD-10-CM | POA: Diagnosis not present

## 2022-09-05 DIAGNOSIS — Z3A35 35 weeks gestation of pregnancy: Secondary | ICD-10-CM

## 2022-09-05 DIAGNOSIS — O26873 Cervical shortening, third trimester: Secondary | ICD-10-CM

## 2022-09-05 DIAGNOSIS — O365930 Maternal care for other known or suspected poor fetal growth, third trimester, not applicable or unspecified: Secondary | ICD-10-CM | POA: Insufficient documentation

## 2022-09-05 DIAGNOSIS — O26843 Uterine size-date discrepancy, third trimester: Secondary | ICD-10-CM | POA: Diagnosis not present

## 2022-09-05 NOTE — Procedures (Signed)
Katrina Barnett 2000-09-26 [redacted]w[redacted]d  Fetus A Non-Stress Test Interpretation for 09/05/22  Indication: IUGR  Fetal Heart Rate A Mode: External Baseline Rate (A): 145 bpm Variability: Moderate Accelerations: 15 x 15 Decelerations: None Multiple birth?: No  Uterine Activity Mode: Toco Contraction Frequency (min): one u/c with ui Contraction Duration (sec): 60 Contraction Quality: Mild Resting Tone Palpated: Relaxed  Interpretation (Fetal Testing) Nonstress Test Interpretation: Reactive Overall Impression: Reassuring for gestational age Comments: tracing reviewed by Dr. Annamaria Boots

## 2022-09-11 ENCOUNTER — Ambulatory Visit: Payer: Medicaid Other

## 2022-09-11 ENCOUNTER — Ambulatory Visit (HOSPITAL_BASED_OUTPATIENT_CLINIC_OR_DEPARTMENT_OTHER): Payer: Medicaid Other

## 2022-09-11 ENCOUNTER — Ambulatory Visit: Payer: Medicaid Other | Attending: Obstetrics and Gynecology | Admitting: *Deleted

## 2022-09-11 VITALS — BP 109/54 | HR 83

## 2022-09-11 DIAGNOSIS — O09293 Supervision of pregnancy with other poor reproductive or obstetric history, third trimester: Secondary | ICD-10-CM | POA: Diagnosis not present

## 2022-09-11 DIAGNOSIS — O26873 Cervical shortening, third trimester: Secondary | ICD-10-CM | POA: Diagnosis not present

## 2022-09-11 DIAGNOSIS — O09899 Supervision of other high risk pregnancies, unspecified trimester: Secondary | ICD-10-CM

## 2022-09-11 DIAGNOSIS — O365930 Maternal care for other known or suspected poor fetal growth, third trimester, not applicable or unspecified: Secondary | ICD-10-CM | POA: Diagnosis not present

## 2022-09-11 DIAGNOSIS — O09213 Supervision of pregnancy with history of pre-term labor, third trimester: Secondary | ICD-10-CM | POA: Diagnosis not present

## 2022-09-11 DIAGNOSIS — Z3A36 36 weeks gestation of pregnancy: Secondary | ICD-10-CM

## 2022-09-11 DIAGNOSIS — O365990 Maternal care for other known or suspected poor fetal growth, unspecified trimester, not applicable or unspecified: Secondary | ICD-10-CM

## 2022-09-11 DIAGNOSIS — O0933 Supervision of pregnancy with insufficient antenatal care, third trimester: Secondary | ICD-10-CM | POA: Insufficient documentation

## 2022-09-11 DIAGNOSIS — O0993 Supervision of high risk pregnancy, unspecified, third trimester: Secondary | ICD-10-CM

## 2022-09-11 DIAGNOSIS — O26843 Uterine size-date discrepancy, third trimester: Secondary | ICD-10-CM

## 2022-09-11 DIAGNOSIS — O26879 Cervical shortening, unspecified trimester: Secondary | ICD-10-CM

## 2022-09-11 NOTE — Procedures (Signed)
Katrina Barnett 04/01/2001 [redacted]w[redacted]d  Fetus A Non-Stress Test Interpretation for 09/11/22  Indication:  Fetal Growth Restriction   Fetal Heart Rate A Mode: External Baseline Rate (A): 130 bpm Variability: Moderate Accelerations: 15 x 15 Decelerations: None Multiple birth?: No  Uterine Activity Mode: Toco Contraction Frequency (min): UI Contraction Quality: Mild Resting Tone Palpated: Relaxed  Interpretation (Fetal Testing) Nonstress Test Interpretation: Reactive Overall Impression: Reassuring for gestational age Comments: Tracing reviewed by Dr.Fang

## 2022-09-13 ENCOUNTER — Encounter: Payer: Self-pay | Admitting: Obstetrics and Gynecology

## 2022-09-13 ENCOUNTER — Other Ambulatory Visit (HOSPITAL_COMMUNITY): Payer: Self-pay | Admitting: Advanced Practice Midwife

## 2022-09-14 ENCOUNTER — Encounter (HOSPITAL_COMMUNITY): Payer: Self-pay | Admitting: Obstetrics & Gynecology

## 2022-09-14 ENCOUNTER — Inpatient Hospital Stay (HOSPITAL_COMMUNITY)
Admission: AD | Admit: 2022-09-14 | Discharge: 2022-09-16 | DRG: 806 | Disposition: A | Discharge status: Home / Self Care | Payer: Medicaid Other | Attending: Obstetrics & Gynecology | Admitting: Obstetrics & Gynecology

## 2022-09-14 DIAGNOSIS — Z3A36 36 weeks gestation of pregnancy: Secondary | ICD-10-CM

## 2022-09-14 DIAGNOSIS — Z8751 Personal history of pre-term labor: Secondary | ICD-10-CM

## 2022-09-14 DIAGNOSIS — O42913 Preterm premature rupture of membranes, unspecified as to length of time between rupture and onset of labor, third trimester: Principal | ICD-10-CM | POA: Diagnosis present

## 2022-09-14 DIAGNOSIS — O365930 Maternal care for other known or suspected poor fetal growth, third trimester, not applicable or unspecified: Secondary | ICD-10-CM | POA: Diagnosis present

## 2022-09-14 DIAGNOSIS — O365931 Maternal care for other known or suspected poor fetal growth, third trimester, fetus 1: Secondary | ICD-10-CM

## 2022-09-14 DIAGNOSIS — Z3A34 34 weeks gestation of pregnancy: Secondary | ICD-10-CM

## 2022-09-14 DIAGNOSIS — O26873 Cervical shortening, third trimester: Secondary | ICD-10-CM | POA: Diagnosis not present

## 2022-09-14 DIAGNOSIS — O26879 Cervical shortening, unspecified trimester: Secondary | ICD-10-CM | POA: Diagnosis present

## 2022-09-14 DIAGNOSIS — O42919 Preterm premature rupture of membranes, unspecified as to length of time between rupture and onset of labor, unspecified trimester: Principal | ICD-10-CM | POA: Diagnosis present

## 2022-09-14 DIAGNOSIS — O365990 Maternal care for other known or suspected poor fetal growth, unspecified trimester, not applicable or unspecified: Secondary | ICD-10-CM | POA: Diagnosis present

## 2022-09-14 LAB — CBC
HCT: 32.5 % — ABNORMAL LOW (ref 36.0–46.0)
Hemoglobin: 11 g/dL — ABNORMAL LOW (ref 12.0–15.0)
MCH: 30.4 pg (ref 26.0–34.0)
MCHC: 33.8 g/dL (ref 30.0–36.0)
MCV: 89.8 fL (ref 80.0–100.0)
Platelets: 316 10*3/uL (ref 150–400)
RBC: 3.62 MIL/uL — ABNORMAL LOW (ref 3.87–5.11)
RDW: 12.8 % (ref 11.5–15.5)
WBC: 14.4 10*3/uL — ABNORMAL HIGH (ref 4.0–10.5)
nRBC: 0 % (ref 0.0–0.2)

## 2022-09-14 LAB — POCT FERN TEST: POCT Fern Test: POSITIVE

## 2022-09-14 LAB — TYPE AND SCREEN
ABO/RH(D): B POS
Antibody Screen: NEGATIVE

## 2022-09-14 LAB — RPR: RPR Ser Ql: NONREACTIVE

## 2022-09-14 MED ORDER — LACTATED RINGERS IV SOLN: INTRAVENOUS | Status: DC

## 2022-09-14 MED ORDER — SOD CITRATE-CITRIC ACID 500-334 MG/5ML PO SOLN: 30.0000 mL | ORAL | Status: DC | PRN

## 2022-09-14 MED ORDER — BENZOCAINE-MENTHOL 20-0.5 % EX AERO
1.0000 | INHALATION_SPRAY | CUTANEOUS | Status: DC | PRN
  Filled 2022-09-14: qty 56

## 2022-09-14 MED ORDER — FENTANYL CITRATE (PF) 100 MCG/2ML IJ SOLN: 100.0000 ug | INTRAMUSCULAR | Status: DC | PRN

## 2022-09-14 MED ORDER — DIBUCAINE (PERIANAL) 1 % EX OINT: 1.0000 | TOPICAL_OINTMENT | CUTANEOUS | Status: DC | PRN

## 2022-09-14 MED ORDER — ACETAMINOPHEN 325 MG PO TABS
650.0000 mg | ORAL_TABLET | ORAL | Status: DC | PRN
  Administered 2022-09-14: 650 mg via ORAL
  Filled 2022-09-14: qty 2

## 2022-09-14 MED ORDER — OXYTOCIN-SODIUM CHLORIDE 30-0.9 UT/500ML-% IV SOLN: 2.5000 [IU]/h | INTRAVENOUS | Status: DC

## 2022-09-14 MED ORDER — OXYCODONE-ACETAMINOPHEN 5-325 MG PO TABS
1.0000 | ORAL_TABLET | ORAL | Status: DC | PRN
  Filled 2022-09-14: qty 1

## 2022-09-14 MED ORDER — ACETAMINOPHEN 325 MG PO TABS: 650.0000 mg | ORAL_TABLET | ORAL | Status: DC | PRN

## 2022-09-14 MED ORDER — OXYTOCIN-SODIUM CHLORIDE 30-0.9 UT/500ML-% IV SOLN
INTRAVENOUS | Status: AC
  Administered 2022-09-14: 333 mL via INTRAVENOUS
  Filled 2022-09-14: qty 500

## 2022-09-14 MED ORDER — ONDANSETRON HCL 4 MG/2ML IJ SOLN: 4.0000 mg | Freq: Four times a day (QID) | INTRAMUSCULAR | Status: DC | PRN

## 2022-09-14 MED ORDER — ONDANSETRON HCL 4 MG PO TABS: 4.0000 mg | ORAL_TABLET | ORAL | Status: DC | PRN

## 2022-09-14 MED ORDER — OXYTOCIN BOLUS FROM INFUSION: 333.0000 mL | Freq: Once | INTRAVENOUS | Status: DC

## 2022-09-14 MED ORDER — LIDOCAINE HCL (PF) 1 % IJ SOLN: 30.0000 mL | INTRAMUSCULAR | Status: DC | PRN

## 2022-09-14 MED ORDER — ZOLPIDEM TARTRATE 5 MG PO TABS: 5.0000 mg | ORAL_TABLET | Freq: Every evening | ORAL | Status: DC | PRN

## 2022-09-14 MED ORDER — PRENATAL MULTIVITAMIN CH
1.0000 | ORAL_TABLET | Freq: Every day | ORAL | Status: DC
  Administered 2022-09-14 – 2022-09-16 (×3): 1 via ORAL
  Filled 2022-09-14 (×3): qty 1

## 2022-09-14 MED ORDER — OXYCODONE-ACETAMINOPHEN 5-325 MG PO TABS: 2.0000 | ORAL_TABLET | ORAL | Status: DC | PRN

## 2022-09-14 MED ORDER — OXYTOCIN BOLUS FROM INFUSION: 333.0000 mL | Freq: Once | INTRAVENOUS | Status: AC

## 2022-09-14 MED ORDER — WITCH HAZEL-GLYCERIN EX PADS: 1.0000 | MEDICATED_PAD | CUTANEOUS | Status: DC | PRN

## 2022-09-14 MED ORDER — SIMETHICONE 80 MG PO CHEW: 80.0000 mg | CHEWABLE_TABLET | ORAL | Status: DC | PRN

## 2022-09-14 MED ORDER — TETANUS-DIPHTH-ACELL PERTUSSIS 5-2.5-18.5 LF-MCG/0.5 IM SUSY: 0.5000 mL | PREFILLED_SYRINGE | Freq: Once | INTRAMUSCULAR | Status: DC

## 2022-09-14 MED ORDER — COCONUT OIL OIL: 1.0000 | TOPICAL_OIL | Status: DC | PRN

## 2022-09-14 MED ORDER — IBUPROFEN 600 MG PO TABS
600.0000 mg | ORAL_TABLET | Freq: Four times a day (QID) | ORAL | Status: DC
  Administered 2022-09-14 – 2022-09-16 (×9): 600 mg via ORAL
  Filled 2022-09-14 (×10): qty 1

## 2022-09-14 MED ORDER — SENNOSIDES-DOCUSATE SODIUM 8.6-50 MG PO TABS
2.0000 | ORAL_TABLET | Freq: Every day | ORAL | Status: DC
  Administered 2022-09-15 – 2022-09-16 (×2): 2 via ORAL
  Filled 2022-09-14 (×2): qty 2

## 2022-09-14 MED ORDER — ONDANSETRON HCL 4 MG/2ML IJ SOLN: 4.0000 mg | INTRAMUSCULAR | Status: DC | PRN

## 2022-09-14 MED ORDER — LACTATED RINGERS IV SOLN: 500.0000 mL | INTRAVENOUS | Status: DC | PRN

## 2022-09-14 MED ORDER — FENTANYL CITRATE (PF) 100 MCG/2ML IJ SOLN
50.0000 ug | INTRAMUSCULAR | Status: DC | PRN
  Filled 2022-09-14: qty 2

## 2022-09-14 MED ORDER — DIPHENHYDRAMINE HCL 25 MG PO CAPS: 25.0000 mg | ORAL_CAPSULE | Freq: Four times a day (QID) | ORAL | Status: DC | PRN

## 2022-09-14 NOTE — Lactation Note (Signed)
This note was copied from a baby's chart. Lactation Consultation Note  Patient Name: Katrina Barnett HQPRF'F Date: 09/14/2022 Reason for consult: Initial assessment;1st time breastfeeding;Infant < 6lbs;Late-preterm 34-36.6wks Age:23 hours Mom stated baby latched after delivery. Attempted to give baby DBM but wouldn't take it. Did get 2 ml in baby finally.  Discussed pumping w/mom. Attempted to reviewed pump w/mom, mom stated she used that same pump for 8 months she is familiar w/pump and doesn't need instructions. Mom doesn't want to pump at this time. Doesn't want to give formula wants to give DBM and her BM. Reviewed LPI behavior and feeding time. Gave crib card and LPI information sheet and reviewed. Encouraged mom to call for assistance or questions as needed.  Maternal Data Has patient been taught Hand Expression?: Yes Does the patient have breastfeeding experience prior to this delivery?: Yes How long did the patient breastfeed?: pumped for 8 months  Feeding Mother's Current Feeding Choice: Breast Milk and Donor Milk Nipple Type: Nfant Slow Flow (purple)  LATCH Score Latch: Repeated attempts needed to sustain latch, nipple held in mouth throughout feeding, stimulation needed to elicit sucking reflex.  Audible Swallowing: None  Type of Nipple: Everted at rest and after stimulation  Comfort (Breast/Nipple): Soft / non-tender  Hold (Positioning): Full assist, staff holds infant at breast  LATCH Score: 5   Lactation Tools Discussed/Used Tools: Pump;Flanges Flange Size: 21 Breast pump type: Double-Electric Breast Pump Pump Education: Setup, frequency, and cleaning;Milk Storage Reason for Pumping: LPI Pumping frequency: q3hr  Interventions Interventions: DEBP;LC Services brochure;LPT handout/interventions  Discharge Pump: DEBP WIC Program: Yes  Consult Status Consult Status: Follow-up Date: 09/14/22 Follow-up type: In-patient    Delana Manganello, Elta [place] 09/14/2022, 7:09  AM
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
## 2022-09-14 NOTE — Lactation Note (Signed)
This note was copied from a baby's chart. Lactation Consultation Note  Patient Name: Katrina Barnett WSFKC'L Date: 09/14/2022   Age:23 hours Per Birth Parent, infant was given 10 mls of donor breast milk prior to lC entering the room and she pumped 10 mls when using the DEBP that she plans to offer at the next feeding. Birth Parent will continue to attempt to latch infant at the breast first for  every feeding and after few minutes if infant doesn't latch  she will stop and supplement infant with her EBM first and then donor milk. Per birth Parent ,her 1st child was in NICU for 3 weeks and she exclusively pumped for 7 months.  LC reviewed LPTI feeding policy with Birth Parent. Birth Parent will continue to use DEBP every 3 hours for 15 minutes on initial setting, Birth parent knows EBM is safe at room temperature for 4 hours whereas donor breast milk must be used within 1 hour.  Maternal Data    Feeding Nipple Type: Extra Slow Flow  LATCH Score Latch: Too sleepy or reluctant, no latch achieved, no sucking elicited.  Audible Swallowing: None  Type of Nipple: Everted at rest and after stimulation  Comfort (Breast/Nipple): Soft / non-tender  Hold (Positioning): Assistance needed to correctly position infant at breast and maintain latch.  LATCH Score: 5   Lactation Tools Discussed/Used    Interventions Interventions: Assisted with latch;Skin to skin  Discharge    Consult Status      Eulis Canner 09/14/2022, 5:19 PM

## 2022-09-14 NOTE — MAU Note (Signed)
Patient called out at 0320 requesting pain relief.  [phone]- RN pulled fentanyl out of pyxis and entered patient's room and found patient had an instance of emesis and rating contractions 10/10, patient thrashing in bed unable to lay flat on back. This RN attempted to get SVE on patient but patient unable to lay still at that time. RN at bedside.    0330- Fatima Blank CNM entered room, patient SVE 8/70/-2   0336- This RN, Loura Back NT, and Fatima Blank CNM rushed patient upstairs via stretcher to LD 216.

## 2022-09-14 NOTE — MAU Note (Signed)
.  Katrina Barnett is a 22 y.o. at [redacted]w[redacted]d here in MAU reporting: contractions starting around 0200 every 5 minutes. Patient reports leaking of fluid in lobby of MAU around 0150; reports fluid as clear and thin. Denies VB and DFM.   [facility]   Onset of complaint: 09/14/22 Pain score: 5/10 Vitals:   09/14/22 0226  BP: 134/72  Pulse: (!) 102  Resp: 18  Temp: 98.3 F (36.8 C)     FHT:140 Lab orders placed from triage:  FERN

## 2022-09-14 NOTE — Discharge Summary (Signed)
Postpartum Discharge Summary  Date of Service updated***     Patient Name: Katrina Barnett DOB: September 12, 2000 MRN: [id number]  Date of admission: 09/14/2022 Delivery date:09/14/2022  Delivering provider: Concepcion Living  Date of discharge: 09/14/2022  Admitting diagnosis: Labor and delivery, indication for care [O75.9] IUGR (intrauterine growth restriction) affecting care of mother [O36.5990] Intrauterine pregnancy: [redacted]w[redacted]d     Secondary diagnosis:  Principal Problem:   Preterm premature rupture of membranes (PPROM) Active Problems:   History of preterm delivery   Short cervix affecting pregnancy   Fetal growth restriction antepartum   Labor and delivery, indication for care   IUGR (intrauterine growth restriction) affecting care of mother   Vaginal delivery  Additional problems: None    Discharge diagnosis: Preterm Pregnancy Delivered                                              Post partum procedures:{Postpartum procedures:23558} Augmentation:  None Complications: None  Hospital course: Onset of Labor With Vaginal Delivery      22 y.o. yo [id number] at [redacted]w[redacted]d was admitted in Active Labor on 09/14/2022. Labor course was complicated by precipitous delivery   Membrane Rupture Time/Date: 1:50 AM ,09/14/2022   Delivery Method:Vaginal, Spontaneous  Episiotomy: None  Lacerations:  None  Patient had a postpartum course complicated by ***.  She is ambulating, tolerating a regular diet, passing flatus, and urinating well. Patient is discharged home in stable condition on 09/14/22.  Newborn Data: Birth date:09/14/2022  Birth time:3:50 AM  Gender:Female  Living status:Living  Apgars:9 ,9  Weight:   Magnesium Sulfate received: No BMZ received: No Rhophylac:N/A MMR:N/A T-DaP:{Tdap:23962} Flu: No Transfusion:{Transfusion received:30440034}  Physical exam  Vitals:   09/14/22 0226 09/14/22 0405  BP: 134/72 (!) 148/79  Pulse: (!) 102 (!) 105  Resp: 18   Temp: 98.3 F (36.8 C)    TempSrc: Oral    General: {Exam; general:21111117} Lochia: {Desc; appropriate/inappropriate:30686::"appropriate"} Uterine Fundus: {Desc; firm/soft:30687} Incision: {Exam; incision:21111123} DVT Evaluation: {Exam; DGU:4403474} Labs: Lab Results  Component Value Date   WBC 14.4 (H) 09/14/2022   HGB 11.0 (L) 09/14/2022   HCT 32.5 (L) 09/14/2022   MCV 89.8 09/14/2022   PLT 316 09/14/2022      Latest Ref Rng & Units 10/05/2021   12:25 PM  CMP  Glucose 70 - 99 mg/dL 109   BUN 6 - 20 mg/dL 19   Creatinine 0.44 - 1.00 mg/dL 0.77   Sodium 135 - 145 mmol/L 138   Potassium 3.5 - 5.1 mmol/L 3.4   Chloride 98 - 111 mmol/L 97   CO2 22 - 32 mmol/L 28   Calcium 8.9 - 10.3 mg/dL 10.4    Edinburgh Score:    05/21/2018    3:41 PM  Edinburgh Postnatal Depression Scale Screening Tool  I have been able to laugh and see the funny side of things. 0  I have looked forward with enjoyment to things. 0  I have blamed myself unnecessarily when things went wrong. 0  I have been anxious or worried for no good reason. 0  I have felt scared or panicky for no good reason. 0  Things have been getting on top of me. 0  I have been so unhappy that I have had difficulty sleeping. 0  I have felt sad or miserable. 0  I have been so unhappy that I  have been crying. 0  The thought of harming myself has occurred to me. 0  Edinburgh Postnatal Depression Scale Total 0     After visit meds:  Allergies as of 09/14/2022   No Known Allergies   Med Rec must be completed prior to using this [facility]***        Discharge home in stable condition Infant Feeding: {Baby feeding:23562} Infant Disposition:{CHL IP OB HOME WITH SHFWYO:37858} Discharge instruction: per After Visit Summary and Postpartum booklet. Activity: Advance as tolerated. Pelvic rest for 6 weeks.  Diet: {OB IFOY:77412878} Future Appointments: Future Appointments  Date Time Provider [place]  09/18/2022 12:30 PM [facility] NURSE  [facility]  09/18/2022 12:45 PM [facility] US5 [facility]  09/18/2022  2:15 PM [facility] NST [facility]  09/20/2022 10:15 AM Griffin Basil, MD [facility]  09/27/2022 10:55 AM Griffin Basil, MD [facility]   Follow up Visit: Message sent   Please schedule this patient for a In person postpartum visit in 4 weeks with the following provider: Any provider. Additional Postpartum F/U: None   High risk pregnancy complicated by:  FGR Delivery mode:  Vaginal, Spontaneous  Anticipated Birth Control:  Condoms   09/14/2022 Concepcion Living, MD

## 2022-09-15 ENCOUNTER — Other Ambulatory Visit: Payer: Self-pay

## 2022-09-15 MED ORDER — ACETAMINOPHEN 325 MG PO TABS
650.0000 mg | ORAL_TABLET | ORAL | Status: DC
  Administered 2022-09-15 – 2022-09-16 (×4): 650 mg via ORAL
  Filled 2022-09-15 (×5): qty 2

## 2022-09-15 MED ORDER — OXYCODONE HCL 5 MG PO TABS: 5.0000 mg | ORAL_TABLET | ORAL | Status: DC | PRN

## 2022-09-15 NOTE — Progress Notes (Signed)
POSTPARTUM PROGRESS NOTE  Post Partum Day 1  Subjective:  Katrina Barnett is a 22 y.o. S9H7342 s/p VD s/p PPROM at [redacted]w[redacted]d.  She reports she is doing well. No acute events overnight. She denies any problems with ambulating, voiding or po intake. Denies nausea or vomiting.  Pain is moderately controlled.  Lochia is adeqaute.  Objective: Blood pressure 93/60, pulse 65, temperature 97.7 F (36.5 C), temperature source Oral, resp. rate 16, last menstrual period 01/02/2022, SpO2 99 %, unknown if currently breastfeeding.  Physical Exam:  General: alert, cooperative and no distress Chest: no respiratory distress Heart:regular rate, distal pulses intact Uterine Fundus: firm, appropriately tender DVT Evaluation: No calf swelling or tenderness Extremities: mild edema Skin: warm, dry  Recent Labs    09/14/22 0252  HGB 11.0*  HCT 32.5*    Assessment/Plan: Katrina Barnett is a 22 y.o. [id number] s/p VD at [redacted]w[redacted]d   PPD#1 - Doing well  Routine postpartum care Will adjust pain medication Contraception: condoms Feeding: breast Dispo: Plan for discharge tomorrow.   LOS: 1 day   Shelda Pal, DO OB Fellow  09/15/2022, 8:19 AM

## 2022-09-15 NOTE — Progress Notes (Signed)
Unable to send Babyscripts, pt appears to already enrolled but cannot find email.  Pt education book given to pt.

## 2022-09-15 NOTE — Clinical Social Work Maternal (Addendum)
CLINICAL SOCIAL WORK MATERNAL/CHILD NOTE  Patient Details  Name: Katrina Barnett MRN: [id number] Date of Birth: Sep 27, 2000  Date:  09/15/2022  Clinical Social Worker Initiating Note:  Idamae Lusher, LCSWA Date/Time: Initiated:  09/15/22/1224     Child's Name:  Darlys Gales   Biological Parents:  Mother, Father (FOB: Wandra Scot, DOB: 02/16/1991)   Need for Interpreter:  None   Reason for Referral:  Current Substance Use/Substance Use During Pregnancy     Address:  [address] [place] [address]    Phone number:  [phone] (home)     Additional phone number:   Household Members/Support Persons (HM/SP):   Household Member/Support Person 1, Household Member/Support Person 2, Household Member/Support Person 3, Household Member/Support Person 4   HM/SP Name Relationship DOB or Age  HM/SP -1 Lauren Ward Mother    HM/SP -2   Father    HM/SP -3   Sister    HM/SP -4 [place] Dianne Dun 03/21/2018  HM/SP -5        HM/SP -6        HM/SP -7        HM/SP -8          Natural Supports (not living in the home):  Spouse/significant other, Extended Family   Professional Supports: None   Employment: Full-time   Type of Work: Programme researcher, broadcasting/film/video at [employer]:  [place] arranged:    Museum/gallery curator Resources:  Medicaid   Other Resources:  Physicist, medical  , [place] Considerations Which May Impact Care:  None identified  Strengths:  Ability to meet basic needs  , Home prepared for child  , Pediatrician chosen   Psychotropic Medications:         Pediatrician:    [place] area  Pediatrician List:   [facility] ([place])  [place]      Pediatrician Fax Number:    Risk Factors/Current Problems:  Substance Use     Cognitive State:  Linear Thinking  , Able to Concentrate  , Alert  ,  Goal Oriented     Mood/Affect:  Calm  , Comfortable  , Relaxed  , Interested     CSW Assessment: CSW was consulted due to drug exposed newborn. CSW met with [name] at bedside to complete assessment. When CSW entered room, [name] was observed laying in bed with infant. FOB was present asleep nearby on couch. CSW introduced self and requested to speak with [name] alone. [name] provided verbal consent to complete consult with FOB present. CSW explained reason for consult. [name] presented as calm, was agreeable to consult and remained engaged throughout encounter.   CSW inquired about [name]'s mental health history. [name] denies a history of mental health symptoms/diagnoses and denies a history of postpartum depression/anxiety. [name] states she has not taken psychotropic medications in the past and attended therapy in 2015 but was not able to recall why. [name] identified her parents, FOB, and FOB's parents as supports. [name] denied current SI/HI.  CSW provided education regarding the baby blues period vs. perinatal mood disorders, discussed treatment and gave resources for mental health follow up if concerns arise.  CSW recommends self-evaluation during the postpartum time period using the New Mom Checklist from Postpartum Progress and encouraged [name] to contact a medical professional if symptoms  are noted at any time.    [name] reports she has a bassinet for infant and her father is supposed to purchase a car seat for infant today. CSW explained the hospital car seat program, stating that [name] can purchase a car seat for $30 as a one time option if [name] has exhausted all other resources. [name] shared that she does not anticipate needing assistance to obtain a car seat for infant. [name] states she has all other needed items.   [name] declined review of Sudden Infant Death Syndrome (SIDS) precautions, stating she is aware of safe sleep practices and reviewed SIDS education with care team prior to social work consult.  CSW informed [name] about  hospital drug screen policy due to documented use of marijuana during pregnancy. CSW explained that infant's UDS resulted positive for THC and CDS would be monitored. CSW explained that a CPS report would be made due to infant's UDS resulting positive for THC. [name] expressed understanding. CSW inquired about substance use during pregnancy. [name] states she smoked marijuana 2x/day until October. [name] states that she smoked marijuana 1x/day in November, last use late November 2023. When asked why, [name] states she smoked marijuana due to feeling nauseas and to stimulate her appetite. [name] denied using other illicit substances during pregnancy. [name] denied a history of prior CPS involvement.   CSW placed call to [place] [facility] CPS and made CPS report to social worker Leeroy Bock due to infant's UDS resulting positive for marijuana. Per [place] social worker Leeroy Bock, no barriers to infant's discharge.    CSW identifies no further need for intervention and no barriers to discharge at this time.  CSW Plan/Description:  No Further Intervention Required/No Barriers to Discharge, Perinatal Mood and Anxiety Disorder (PMADs) Education, [place], Child Protective Service Report  , CSW Will Continue to Monitor Umbilical Cord Tissue Drug Screen Results and Make Report if Herma Carson, [place] 09/15/2022, 12:29 PM

## 2022-09-15 NOTE — Progress Notes (Signed)
Circumcision Consent   -Circumcision procedure details discussed including usage of local anesthesia, infant soother, and removal and disposal of foreskin. -Risks and benefits of procedure were reviewed including, but not limited to:  *Benefits include reduction in the rates of urinary tract infection (UTI), penile cancer, some sexually transmitted infections, penile inflammatory, and retractile disorders, as well as easier hygiene.   *Risks include bleeding, infection, injury of glans which may lead to need for additional surgery, penile deformity, or urinary tract issues, unsatisfactory cosmetic appearance and other potential complications related to the procedure.   -Informed that procedure will not be performed if provider deems inappropriate d/t penile size, noted deformity, or unsatisfactory pediatric evaluation. -It was emphasized that this is an elective procedure.   -Post circumcision care discussed.   Patient wants to proceed with circumcision. Informed that team would be notified and complete prior to discharge and upon satisfactory pediatric evaluation of infant.    Shelda Pal, [place] Fellow, Faculty practice [place] for [place] 8:17 AM

## 2022-09-15 NOTE — Lactation Note (Addendum)
This note was copied from a baby's chart. Lactation Consultation Note  Patient Name: Katrina Barnett OZYYQ'M Date: 09/15/2022 Reason for consult: Follow-up assessment;Late-preterm 34-36.6wks;Infant < 6lbs Age:23 hours  LC in to visit with P2 Mom of LPTI delivered vaginally.  Baby is at a 2% weight loss.  Baby has been supplemented with donor breast milk by bottle using an extra slow flow nipple.  SLP sent up Dr. Owens Shark preemie nipples as baby has been struggling to take volumes over 5-12 ml.    Mom states she hasn't pumped since once this am, for 5 ml of colostrum.    Hand's free pumping band provided and assisted with pumping and Mom was very relieved to not have to hold the pumps in place.  LC tried to place baby in the band for STS, but he became fussy.  Baby swaddled and placed in semi-upright position in bed next to Mom.  Reviewed importance of disassembling pump parts, washing, rinsing and air drying in separate bin provided.    1-Encouraged STS with baby as much as possible. 2-Baby to feed at least every 3 hrs, sooner if baby acts hungry.  EBM+/donor breast milk.  Volume on crib card in baby's bed.  Mom aware  3-Mom to pump every 2-3 hrs when awake.    Encouraged to ask for help prn Mom to call [facility] tomorrow, as faxed request for DEBP was made on admission.   Lactation Tools Discussed/Used Tools: Pump;Flanges;Hands-free pumping top;Bottle Flange Size: 21 Breast pump type: Double-Electric Breast Pump Pump Education: Setup, frequency, and cleaning;Milk Storage Pumping frequency: Mom hasn't pumped consistently/encouraged pumping every 2-3 hrs when awake Pumped volume: 5 mL  Interventions Interventions: Breast feeding basics reviewed;Skin to skin;Breast massage;Hand express;DEBP;Education;Pace feeding;LPT handout/interventions  Consult Status Consult Status: Follow-up Date: 09/16/22 Follow-up type: In-patient    [name] 09/15/2022, 3:17 PM

## 2022-09-16 ENCOUNTER — Other Ambulatory Visit (HOSPITAL_COMMUNITY): Payer: Self-pay

## 2022-09-16 MED ORDER — BENZOCAINE-MENTHOL 20-0.5 % EX AERO
1.0000 | INHALATION_SPRAY | CUTANEOUS | 0 refills | Status: DC | PRN
  Filled 2022-09-16: qty 56, fill #0

## 2022-09-16 MED ORDER — ACETAMINOPHEN 325 MG PO TABS
650.0000 mg | ORAL_TABLET | ORAL | 0 refills | Status: DC
  Filled 2022-09-16: qty 35, 3d supply, fill #0

## 2022-09-16 MED ORDER — IBUPROFEN 600 MG PO TABS
600.0000 mg | ORAL_TABLET | Freq: Four times a day (QID) | ORAL | 0 refills | Status: DC
  Filled 2022-09-16: qty 30, 8d supply, fill #0

## 2022-09-16 NOTE — Lactation Note (Signed)
This note was copied from a baby's chart. Lactation Consultation Note  Patient Name: Katrina Barnett OZHYQ'M Date: 09/16/2022 Age : 23 hours old  Reason for consult: Follow-up assessment;Late-preterm 34-36.6wks;Infant < 6lbs;Infant weight loss (3 % weight loss) As LC entered the room, and mom holding baby. Baby awake and latched himself with depth,swallows, and per mom comfortable.  Baby fed 22 minutes- Latch score 10.  Per [facility] and mom mentioned the baby is in need a circ prior to D/C  Later today.  LC reviewed importance of feeding with cues and by 3 hours/ STS.  Feed the 1st breast 15 -20 mins , and then supplement working up to 30 ml of EBM is available and formula if not to enhance steady growth. Post pump both breast for 10-15 mins and save milk for the next feeding.  LC reviewed engorgement prevention and tx  LC offered to request a F/U LC O/P appt and mom verbally consented.  Mom aware she will receive a call back from the [facility] O/P to set up a appt.  Mom qualified for the [facility] delivered it in the room.   Maternal Data    Feeding Mother's Current Feeding Choice: Breast Milk and Donor Milk Nipple Type: Extra Slow Flow  LATCH Score Latch: Grasps breast easily, tongue down, lips flanged, rhythmical sucking.  Audible Swallowing: Spontaneous and intermittent  Type of Nipple: Everted at rest and after stimulation  Comfort (Breast/Nipple): Soft / non-tender  Hold (Positioning): No assistance needed to correctly position infant at breast.  LATCH Score: 10   Lactation Tools Discussed/Used  DEBP kit with hand pump   Interventions Interventions: Breast feeding basics reviewed;Assisted with latch;Hand pump;DEBP;Education;LC Services brochure  Discharge Discharge Education: Engorgement and breast care;Warning signs for feeding baby;Outpatient recommendation;Outpatient Epic message sent [facility] Program: Yes  Consult Status Consult Status: Complete Date:  09/16/22    Myer Haff 09/16/2022, 1:59 PM

## 2022-09-18 ENCOUNTER — Ambulatory Visit: Payer: Medicaid Other

## 2022-09-20 ENCOUNTER — Encounter: Payer: Self-pay | Admitting: Obstetrics and Gynecology

## 2022-09-22 ENCOUNTER — Inpatient Hospital Stay (HOSPITAL_COMMUNITY)
Admission: RE | Admit: 2022-09-22 | Payer: Medicaid Other | Source: Home / Self Care | Admitting: Obstetrics and Gynecology

## 2022-09-22 ENCOUNTER — Inpatient Hospital Stay (HOSPITAL_COMMUNITY): Payer: Medicaid Other

## 2022-09-23 ENCOUNTER — Inpatient Hospital Stay (HOSPITAL_COMMUNITY): Payer: Medicaid Other

## 2022-09-25 ENCOUNTER — Telehealth (HOSPITAL_COMMUNITY): Payer: Self-pay | Admitting: *Deleted

## 2022-09-25 NOTE — Telephone Encounter (Signed)
Mom reports feeling good. No concerns about herself at this time. EPDS=1 [facility] score=8) Mom reports baby is doing well. Feeding, peeing, and pooping without difficulty. Safe sleep reviewed. Mom reports no concerns about baby at present.  Odis Hollingshead, RN 09-25-2022 at 4;06pm

## 2022-09-27 ENCOUNTER — Encounter: Payer: Self-pay | Admitting: Obstetrics and Gynecology

## 2022-10-04 ENCOUNTER — Encounter: Payer: Self-pay | Admitting: Obstetrics and Gynecology

## 2022-10-14 ENCOUNTER — Other Ambulatory Visit: Payer: Self-pay

## 2022-10-14 ENCOUNTER — Ambulatory Visit: Payer: Medicaid Other | Admitting: Advanced Practice Midwife

## 2022-10-14 ENCOUNTER — Encounter: Payer: Self-pay | Admitting: Obstetrics and Gynecology

## 2022-11-11 NOTE — Progress Notes (Deleted)
    [place] Part[facility] Visit Note  Katrina Barnett is a 22 y.o. [phone] female who presents for a postpartum visit. She is 8 weeks postpartum following a normal spontaneous vaginal delivery.  I have fully reviewed the prenatal and intrapartum course. The delivery was at 36.3 gestational weeks.  Anesthesia: none. Postpartum course has been ***. Baby is doing well***. Baby is feeding by breast. Bleeding {vag bleed:12292}. Bowel function is {normal:32111}. Bladder function is {normal:32111}. Patient {is/is not:9024} sexually active. Contraception method is {contraceptive method:5051}. Postpartum depression screening: {gen negative/positive:315881}.   The pregnancy intention screening data noted above was reviewed. Potential methods of contraception were discussed. The patient elected to proceed with No data recorded.    Health Maintenance Due  Topic Date Due   COVID-19 Vaccine (1) Never done   HPV VACCINES (1 - 2-dose series) Never done   PAP-Cervical Cytology Screening  Never done   PAP SMEAR-Modifier  Never done    {Common ambulatory SmartLinks:19316}  Review of Systems {ros; complete:30496}  Objective:  LMP 01/02/2022    General:  {gen appearance:16600}   Breasts:  {desc; normal/abnormal/not indicated:14647}  Lungs: {lung exam:16931}  Heart:  {heart exam:5510}  Abdomen: {abdomen exam:16834}   Wound {Wound assessment:11097}  GU exam:  {desc; normal/abnormal/not indicated:14647}       Assessment:    There are no diagnoses linked to this encounter.  *** postpartum exam.   Plan:   Essential components of care per ACOG recommendations:  1.  Mood and well being: Patient with {gen negative/positive:315881} depression screening today. Reviewed local resources for support.  - Patient tobacco use? {tobacco use:25506}  - hx of drug use? {yes/no:25505}    2. Infant care and feeding:  -Patient currently breastmilk feeding? {yes/no:25502}  -Social determinants of health (SDOH) reviewed in  EPIC. No concerns***The following needs were identified***  3. Sexuality, contraception and birth spacing - Patient {DOES_DOES JZ:4998275 want a pregnancy in the next year.  Desired family size is {NUMBER 1-10:22536} children.  - Reviewed reproductive life planning. Reviewed contraceptive methods based on pt preferences and effectiveness.  Patient desired {Upstream End Methods:24109} today.   - Discussed birth spacing of 18 months  4. Sleep and fatigue -Encouraged family/partner/community support of 4 hrs of uninterrupted sleep to help with mood and fatigue  5. Physical Recovery  - Discussed patients delivery and complications. She describes her labor as {description:25511} - Patient had a {CHL AMB DELIVERY:[phone]}. Patient had a {laceration:25518} laceration. Perineal healing reviewed. Patient expressed understanding - Patient has urinary incontinence? {yes/no:25515} - Patient {ACTION; IS/IS GI:087931 safe to resume physical and sexual activity  6.  Health Maintenance - HM due items addressed {Yes or If no, why not?:20788} - Last pap smear No results found for: "DIAGPAP" Pap smear {done:10129} at today's visit.  -Breast Cancer screening indicated? {indicated:25516}  7. Chronic Disease/Pregnancy Condition follow up: {Follow up:25499}  [facility] [place]

## 2022-11-12 ENCOUNTER — Ambulatory Visit: Payer: Medicaid Other | Admitting: Family Medicine

## 2023-06-17 ENCOUNTER — Ambulatory Visit
Admission: EM | Admit: 2023-06-17 | Discharge: 2023-06-17 | Disposition: A | Discharge status: Home / Self Care | Payer: Medicaid Other | Attending: Internal Medicine | Admitting: Internal Medicine

## 2023-06-17 DIAGNOSIS — N760 Acute vaginitis: Secondary | ICD-10-CM | POA: Insufficient documentation

## 2023-06-17 DIAGNOSIS — N3000 Acute cystitis without hematuria: Secondary | ICD-10-CM | POA: Diagnosis not present

## 2023-06-17 LAB — POCT URINALYSIS DIP (MANUAL ENTRY)
Bilirubin, UA: NEGATIVE
Blood, UA: NEGATIVE
Glucose, UA: NEGATIVE mg/dL
Ketones, POC UA: NEGATIVE mg/dL
Nitrite, UA: NEGATIVE
Protein Ur, POC: NEGATIVE mg/dL
Spec Grav, UA: 1.02 (ref 1.010–1.025)
Urobilinogen, UA: 1 U/dL
pH, UA: 7 (ref 5.0–8.0)

## 2023-06-17 LAB — POCT URINE PREGNANCY: Preg Test, Ur: NEGATIVE

## 2023-06-17 MED ORDER — CEPHALEXIN 500 MG PO CAPS: 500.0000 mg | ORAL_CAPSULE | Freq: Two times a day (BID) | ORAL | 0 refills | Status: AC

## 2023-06-17 MED ORDER — FLUCONAZOLE 150 MG PO TABS: 150.0000 mg | ORAL_TABLET | Freq: Every day | ORAL | 0 refills | Status: AC

## 2023-06-17 NOTE — Discharge Instructions (Signed)
The clinic will contact you with results of the testing done today if positive.  Start Keflex twice daily for 7 days as well as Diflucan as prescribed.  Lots of rest and fluids.  Please follow-up with your PCP if your symptoms do not improve.  Please go to the ER if you develop any worsening symptoms.  I hope you feel better soon!
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
## 2023-06-17 NOTE — ED Triage Notes (Signed)
Pt presents with c/o possible UTI and concerns of yeast infection.   Pt states she has vaginal itching.

## 2023-06-18 LAB — CERVICOVAGINAL ANCILLARY ONLY
Bacterial Vaginitis (gardnerella): POSITIVE — AB
Candida Glabrata: NEGATIVE
Candida Vaginitis: NEGATIVE
Chlamydia: NEGATIVE
Comment: NEGATIVE
Comment: NEGATIVE
Comment: NEGATIVE
Comment: NEGATIVE
Comment: NEGATIVE
Comment: NORMAL
Neisseria Gonorrhea: NEGATIVE
Trichomonas: POSITIVE — AB

## 2023-06-18 LAB — URINE CULTURE: Culture: NO GROWTH

## 2023-06-19 ENCOUNTER — Telehealth: Payer: Self-pay

## 2023-06-19 MED ORDER — METRONIDAZOLE 500 MG PO TABS: 500.0000 mg | ORAL_TABLET | Freq: Two times a day (BID) | ORAL | 0 refills | Status: AC

## 2023-06-19 NOTE — Telephone Encounter (Signed)
 Per protocol, pt requires tx with metronidazole. Attempted to reach patient x1. LVM. Rx sent to pharmacy on file.

## 2023-12-31 ENCOUNTER — Other Ambulatory Visit: Payer: Self-pay

## 2023-12-31 ENCOUNTER — Emergency Department (HOSPITAL_BASED_OUTPATIENT_CLINIC_OR_DEPARTMENT_OTHER): Admitting: Anesthesiology

## 2023-12-31 ENCOUNTER — Encounter (HOSPITAL_COMMUNITY): Payer: Self-pay | Admitting: Emergency Medicine

## 2023-12-31 ENCOUNTER — Observation Stay (HOSPITAL_COMMUNITY)
Admission: EM | Admit: 2023-12-31 | Discharge: 2024-01-01 | Disposition: A | Discharge status: Home / Self Care | Attending: Obstetrics & Gynecology | Admitting: Obstetrics & Gynecology

## 2023-12-31 ENCOUNTER — Observation Stay (HOSPITAL_COMMUNITY)

## 2023-12-31 ENCOUNTER — Encounter (HOSPITAL_COMMUNITY)
Admission: EM | Disposition: A | Discharge status: Home / Self Care | Payer: Self-pay | Source: Home / Self Care | Attending: Emergency Medicine

## 2023-12-31 ENCOUNTER — Emergency Department (HOSPITAL_COMMUNITY)

## 2023-12-31 ENCOUNTER — Emergency Department (HOSPITAL_COMMUNITY): Admitting: Anesthesiology

## 2023-12-31 DIAGNOSIS — O009 Unspecified ectopic pregnancy without intrauterine pregnancy: Secondary | ICD-10-CM

## 2023-12-31 DIAGNOSIS — Z1152 Encounter for screening for COVID-19: Secondary | ICD-10-CM | POA: Insufficient documentation

## 2023-12-31 DIAGNOSIS — K661 Hemoperitoneum: Secondary | ICD-10-CM | POA: Diagnosis present

## 2023-12-31 DIAGNOSIS — R079 Chest pain, unspecified: Secondary | ICD-10-CM | POA: Diagnosis not present

## 2023-12-31 DIAGNOSIS — Z3A00 Weeks of gestation of pregnancy not specified: Secondary | ICD-10-CM

## 2023-12-31 DIAGNOSIS — N836 Hematosalpinx: Secondary | ICD-10-CM | POA: Diagnosis not present

## 2023-12-31 DIAGNOSIS — O00102 Left tubal pregnancy without intrauterine pregnancy: Principal | ICD-10-CM | POA: Insufficient documentation

## 2023-12-31 DIAGNOSIS — R1030 Lower abdominal pain, unspecified: Secondary | ICD-10-CM | POA: Diagnosis not present

## 2023-12-31 DIAGNOSIS — R1084 Generalized abdominal pain: Secondary | ICD-10-CM | POA: Diagnosis not present

## 2023-12-31 DIAGNOSIS — O00101 Right tubal pregnancy without intrauterine pregnancy: Secondary | ICD-10-CM | POA: Diagnosis not present

## 2023-12-31 DIAGNOSIS — O26899 Other specified pregnancy related conditions, unspecified trimester: Secondary | ICD-10-CM | POA: Diagnosis not present

## 2023-12-31 DIAGNOSIS — R0602 Shortness of breath: Secondary | ICD-10-CM | POA: Insufficient documentation

## 2023-12-31 DIAGNOSIS — O3680X0 Pregnancy with inconclusive fetal viability, not applicable or unspecified: Secondary | ICD-10-CM | POA: Diagnosis not present

## 2023-12-31 DIAGNOSIS — R61 Generalized hyperhidrosis: Secondary | ICD-10-CM | POA: Diagnosis not present

## 2023-12-31 LAB — CBC WITH DIFFERENTIAL/PLATELET
Abs Immature Granulocytes: 0.01 10*3/uL (ref 0.00–0.07)
Basophils Absolute: 0 10*3/uL (ref 0.0–0.1)
Basophils Relative: 0 %
Eosinophils Absolute: 0.1 10*3/uL (ref 0.0–0.5)
Eosinophils Relative: 1 %
HCT: 37.2 % (ref 36.0–46.0)
Hemoglobin: 12.4 g/dL (ref 12.0–15.0)
Immature Granulocytes: 0 %
Lymphocytes Relative: 38 %
Lymphs Abs: 2.8 10*3/uL (ref 0.7–4.0)
MCH: 30.4 pg (ref 26.0–34.0)
MCHC: 33.3 g/dL (ref 30.0–36.0)
MCV: 91.2 fL (ref 80.0–100.0)
Monocytes Absolute: 0.6 10*3/uL (ref 0.1–1.0)
Monocytes Relative: 8 %
Neutro Abs: 3.9 10*3/uL (ref 1.7–7.7)
Neutrophils Relative %: 53 %
Platelets: 287 10*3/uL (ref 150–400)
RBC: 4.08 MIL/uL (ref 3.87–5.11)
RDW: 12.3 % (ref 11.5–15.5)
WBC: 7.4 10*3/uL (ref 4.0–10.5)
nRBC: 0 % (ref 0.0–0.2)

## 2023-12-31 LAB — I-STAT CHEM 8, ED
BUN: 7 mg/dL (ref 6–20)
Calcium, Ion: 1.18 mmol/L (ref 1.15–1.40)
Chloride: 101 mmol/L (ref 98–111)
Creatinine, Ser: 0.8 mg/dL (ref 0.44–1.00)
Glucose, Bld: 173 mg/dL — ABNORMAL HIGH (ref 70–99)
HCT: 34 % — ABNORMAL LOW (ref 36.0–46.0)
Hemoglobin: 11.6 g/dL — ABNORMAL LOW (ref 12.0–15.0)
Potassium: 2.8 mmol/L — ABNORMAL LOW (ref 3.5–5.1)
Sodium: 140 mmol/L (ref 135–145)
TCO2: 22 mmol/L (ref 22–32)

## 2023-12-31 LAB — CBC
HCT: 19 % — ABNORMAL LOW (ref 36.0–46.0)
HCT: 26 % — ABNORMAL LOW (ref 36.0–46.0)
Hemoglobin: 6.4 g/dL — CL (ref 12.0–15.0)
Hemoglobin: 9.4 g/dL — ABNORMAL LOW (ref 12.0–15.0)
MCH: 31.5 pg (ref 26.0–34.0)
MCH: 30.5 pg (ref 26.0–34.0)
MCHC: 33.7 g/dL (ref 30.0–36.0)
MCHC: 36.2 g/dL — ABNORMAL HIGH (ref 30.0–36.0)
MCV: 93.6 fL (ref 80.0–100.0)
MCV: 84.4 fL (ref 80.0–100.0)
Platelets: 187 10*3/uL (ref 150–400)
Platelets: 202 10*3/uL (ref 150–400)
RBC: 2.03 MIL/uL — ABNORMAL LOW (ref 3.87–5.11)
RBC: 3.08 MIL/uL — ABNORMAL LOW (ref 3.87–5.11)
RDW: 12.2 % (ref 11.5–15.5)
RDW: 15.8 % — ABNORMAL HIGH (ref 11.5–15.5)
WBC: 14.5 10*3/uL — ABNORMAL HIGH (ref 4.0–10.5)
WBC: 19.5 10*3/uL — ABNORMAL HIGH (ref 4.0–10.5)
nRBC: 0 % (ref 0.0–0.2)
nRBC: 0 % (ref 0.0–0.2)

## 2023-12-31 LAB — BASIC METABOLIC PANEL WITH GFR
Anion gap: 11 (ref 5–15)
BUN: 8 mg/dL (ref 6–20)
CO2: 23 mmol/L (ref 22–32)
Calcium: 9.1 mg/dL (ref 8.9–10.3)
Chloride: 104 mmol/L (ref 98–111)
Creatinine, Ser: 0.81 mg/dL (ref 0.44–1.00)
GFR, Estimated: >60 mL/min (ref >60)
Glucose, Bld: 179 mg/dL — ABNORMAL HIGH (ref 70–99)
Potassium: 2.8 mmol/L — ABNORMAL LOW (ref 3.5–5.1)
Sodium: 138 mmol/L (ref 135–145)

## 2023-12-31 LAB — RESP PANEL BY RT-PCR (RSV, FLU A&B, COVID)  RVPGX2
Influenza A by PCR: NEGATIVE
Influenza B by PCR: NEGATIVE
Resp Syncytial Virus by PCR: NEGATIVE
SARS Coronavirus 2 by RT PCR: NEGATIVE

## 2023-12-31 LAB — D-DIMER, QUANTITATIVE: D-Dimer, Quant: 0.58 ug{FEU}/mL — ABNORMAL HIGH (ref 0.00–0.50)

## 2023-12-31 LAB — TROPONIN I (HIGH SENSITIVITY): Troponin I (High Sensitivity): 15 ng/L (ref <18)

## 2023-12-31 LAB — PREPARE RBC (CROSSMATCH)

## 2023-12-31 LAB — HCG, QUANTITATIVE, PREGNANCY: hCG, Beta Chain, Quant, S: 32781 m[IU]/mL — ABNORMAL HIGH (ref <5)

## 2023-12-31 SURGERY — SALPINGECTOMY, UNILATERAL, LAPAROSCOPIC
Anesthesia: General

## 2023-12-31 MED ORDER — ZOLPIDEM TARTRATE 5 MG PO TABS: 5.0000 mg | ORAL_TABLET | Freq: Every evening | ORAL | Status: DC | PRN

## 2023-12-31 MED ORDER — TRANEXAMIC ACID-NACL 1000-0.7 MG/100ML-% IV SOLN
INTRAVENOUS | Status: AC
  Filled 2023-12-31: qty 100

## 2023-12-31 MED ORDER — OXYTOCIN 10 UNIT/ML IJ SOLN
INTRAMUSCULAR | Status: AC
Start: 2023-12-31 — End: ?
  Filled 2023-12-31: qty 1

## 2023-12-31 MED ORDER — LACTATED RINGERS IV SOLN
INTRAVENOUS | Status: DC
Start: 2023-12-31 — End: 2023-12-31

## 2023-12-31 MED ORDER — MIDAZOLAM HCL 5 MG/5ML IJ SOLN
INTRAMUSCULAR | Status: DC | PRN
  Administered 2023-12-31: 2 mg via INTRAVENOUS

## 2023-12-31 MED ORDER — SODIUM CHLORIDE 0.9% IV SOLUTION: Freq: Once | INTRAVENOUS | Status: AC

## 2023-12-31 MED ORDER — ORAL CARE MOUTH RINSE: 15.0000 mL | Freq: Once | OROMUCOSAL | Status: AC

## 2023-12-31 MED ORDER — FENTANYL CITRATE (PF) 100 MCG/2ML IJ SOLN
INTRAMUSCULAR | Status: AC
  Filled 2023-12-31: qty 2

## 2023-12-31 MED ORDER — OXYCODONE HCL 5 MG PO TABS
5.0000 mg | ORAL_TABLET | Freq: Once | ORAL | Status: AC | PRN
  Administered 2023-12-31: 5 mg via ORAL

## 2023-12-31 MED ORDER — PHENYLEPHRINE 80 MCG/ML (10ML) SYRINGE FOR IV PUSH (FOR BLOOD PRESSURE SUPPORT)
PREFILLED_SYRINGE | INTRAVENOUS | Status: AC
  Filled 2023-12-31: qty 20

## 2023-12-31 MED ORDER — LIDOCAINE 2% (20 MG/ML) 5 ML SYRINGE
INTRAMUSCULAR | Status: AC
  Filled 2023-12-31: qty 5

## 2023-12-31 MED ORDER — ONDANSETRON HCL 4 MG PO TABS: 4.0000 mg | ORAL_TABLET | Freq: Four times a day (QID) | ORAL | Status: DC | PRN

## 2023-12-31 MED ORDER — POTASSIUM CHLORIDE CRYS ER 20 MEQ PO TBCR
40.0000 meq | EXTENDED_RELEASE_TABLET | Freq: Once | ORAL | Status: AC
  Administered 2023-12-31: 40 meq via ORAL
  Filled 2023-12-31: qty 2

## 2023-12-31 MED ORDER — BUPIVACAINE HCL (PF) 0.25 % IJ SOLN
INTRAMUSCULAR | Status: DC | PRN
  Administered 2023-12-31: 30 mL

## 2023-12-31 MED ORDER — DROPERIDOL 2.5 MG/ML IJ SOLN: 0.6250 mg | Freq: Once | INTRAMUSCULAR | Status: DC | PRN

## 2023-12-31 MED ORDER — SUCCINYLCHOLINE CHLORIDE 200 MG/10ML IV SOSY
PREFILLED_SYRINGE | INTRAVENOUS | Status: DC | PRN
  Administered 2023-12-31: 60 mg via INTRAVENOUS

## 2023-12-31 MED ORDER — PROPOFOL 10 MG/ML IV BOLUS
INTRAVENOUS | Status: DC | PRN
  Administered 2023-12-31: 20 mg via INTRAVENOUS
  Administered 2023-12-31: 80 mg via INTRAVENOUS

## 2023-12-31 MED ORDER — CHLORHEXIDINE GLUCONATE 0.12 % MT SOLN
15.0000 mL | Freq: Once | OROMUCOSAL | Status: AC
  Administered 2023-12-31: 15 mL via OROMUCOSAL

## 2023-12-31 MED ORDER — ONDANSETRON HCL 4 MG/2ML IJ SOLN: 4.0000 mg | Freq: Four times a day (QID) | INTRAMUSCULAR | Status: DC | PRN

## 2023-12-31 MED ORDER — IBUPROFEN 600 MG PO TABS
600.0000 mg | ORAL_TABLET | Freq: Four times a day (QID) | ORAL | Status: DC
  Administered 2023-12-31 – 2024-01-01 (×4): 600 mg via ORAL
  Filled 2023-12-31 (×4): qty 1

## 2023-12-31 MED ORDER — FENTANYL CITRATE (PF) 100 MCG/2ML IJ SOLN
25.0000 ug | INTRAMUSCULAR | Status: DC | PRN
  Administered 2023-12-31 (×2): 50 ug via INTRAVENOUS

## 2023-12-31 MED ORDER — PHENYLEPHRINE 80 MCG/ML (10ML) SYRINGE FOR IV PUSH (FOR BLOOD PRESSURE SUPPORT)
PREFILLED_SYRINGE | INTRAVENOUS | Status: DC | PRN
  Administered 2023-12-31: 160 ug via INTRAVENOUS
  Administered 2023-12-31: 80 ug via INTRAVENOUS
  Administered 2023-12-31 (×4): 160 ug via INTRAVENOUS
  Administered 2023-12-31: 80 ug via INTRAVENOUS

## 2023-12-31 MED ORDER — ALBUMIN HUMAN 5 % IV SOLN: INTRAVENOUS | Status: DC | PRN

## 2023-12-31 MED ORDER — ACETAMINOPHEN 500 MG PO TABS
1000.0000 mg | ORAL_TABLET | Freq: Four times a day (QID) | ORAL | Status: DC
  Administered 2023-12-31 – 2024-01-01 (×4): 1000 mg via ORAL
  Filled 2023-12-31 (×4): qty 2

## 2023-12-31 MED ORDER — PROPOFOL 10 MG/ML IV BOLUS
INTRAVENOUS | Status: AC
  Filled 2023-12-31: qty 20

## 2023-12-31 MED ORDER — DEXAMETHASONE SODIUM PHOSPHATE 10 MG/ML IJ SOLN
INTRAMUSCULAR | Status: AC
  Filled 2023-12-31: qty 1

## 2023-12-31 MED ORDER — OXYCODONE HCL 5 MG/5ML PO SOLN: 5.0000 mg | Freq: Once | ORAL | Status: AC | PRN

## 2023-12-31 MED ORDER — ONDANSETRON HCL 4 MG/2ML IJ SOLN
INTRAMUSCULAR | Status: DC | PRN
  Administered 2023-12-31: 4 mg via INTRAVENOUS

## 2023-12-31 MED ORDER — TRANEXAMIC ACID-NACL 1000-0.7 MG/100ML-% IV SOLN
INTRAVENOUS | Status: DC | PRN
  Administered 2023-12-31: 1000 mg via INTRAVENOUS

## 2023-12-31 MED ORDER — ONDANSETRON HCL 4 MG/2ML IJ SOLN
INTRAMUSCULAR | Status: AC
  Filled 2023-12-31: qty 2

## 2023-12-31 MED ORDER — FENTANYL CITRATE PF 50 MCG/ML IJ SOSY
50.0000 ug | PREFILLED_SYRINGE | Freq: Once | INTRAMUSCULAR | Status: AC
  Administered 2023-12-31: 50 ug via INTRAVENOUS
  Filled 2023-12-31: qty 1

## 2023-12-31 MED ORDER — SUCCINYLCHOLINE CHLORIDE 200 MG/10ML IV SOSY
PREFILLED_SYRINGE | INTRAVENOUS | Status: AC
  Filled 2023-12-31: qty 10

## 2023-12-31 MED ORDER — DEXMEDETOMIDINE HCL IN NACL 80 MCG/20ML IV SOLN
INTRAVENOUS | Status: DC | PRN
  Administered 2023-12-31: 12 ug via INTRAVENOUS

## 2023-12-31 MED ORDER — FENTANYL CITRATE (PF) 250 MCG/5ML IJ SOLN
INTRAMUSCULAR | Status: AC
  Filled 2023-12-31: qty 5

## 2023-12-31 MED ORDER — FENTANYL CITRATE (PF) 100 MCG/2ML IJ SOLN
INTRAMUSCULAR | Status: DC | PRN
  Administered 2023-12-31 (×2): 50 ug via INTRAVENOUS

## 2023-12-31 MED ORDER — BUPIVACAINE HCL (PF) 0.25 % IJ SOLN
INTRAMUSCULAR | Status: AC
  Filled 2023-12-31: qty 30

## 2023-12-31 MED ORDER — ROCURONIUM BROMIDE 10 MG/ML (PF) SYRINGE
PREFILLED_SYRINGE | INTRAVENOUS | Status: DC | PRN
  Administered 2023-12-31: 40 mg via INTRAVENOUS

## 2023-12-31 MED ORDER — SUGAMMADEX SODIUM 200 MG/2ML IV SOLN
INTRAVENOUS | Status: DC | PRN
  Administered 2023-12-31: 100 mg via INTRAVENOUS

## 2023-12-31 MED ORDER — LIDOCAINE 2% (20 MG/ML) 5 ML SYRINGE
INTRAMUSCULAR | Status: DC | PRN
  Administered 2023-12-31: 60 mg via INTRAVENOUS

## 2023-12-31 MED ORDER — SODIUM CHLORIDE 0.9 % IR SOLN
Status: DC | PRN
  Administered 2023-12-31: 1000 mL

## 2023-12-31 MED ORDER — SODIUM CHLORIDE 0.9 % IV BOLUS
1000.0000 mL | Freq: Once | INTRAVENOUS | Status: AC
  Administered 2023-12-31: 1000 mL via INTRAVENOUS

## 2023-12-31 MED ORDER — ROCURONIUM BROMIDE 10 MG/ML (PF) SYRINGE
PREFILLED_SYRINGE | INTRAVENOUS | Status: AC
  Filled 2023-12-31: qty 10

## 2023-12-31 MED ORDER — POVIDONE-IODINE 10 % EX SWAB
2.0000 | Freq: Once | CUTANEOUS | Status: AC
  Administered 2023-12-31: 2 via TOPICAL

## 2023-12-31 MED ORDER — DEXAMETHASONE SODIUM PHOSPHATE 10 MG/ML IJ SOLN
INTRAMUSCULAR | Status: DC | PRN
Start: 2023-12-31 — End: 2023-12-31
  Administered 2023-12-31: 10 mg via INTRAVENOUS

## 2023-12-31 MED ORDER — POTASSIUM CHLORIDE 10 MEQ/100ML IV SOLN
10.0000 meq | Freq: Once | INTRAVENOUS | Status: AC
  Administered 2023-12-31: 10 meq via INTRAVENOUS
  Filled 2023-12-31: qty 100

## 2023-12-31 MED ORDER — SODIUM CHLORIDE 0.9 % IV SOLN: INTRAVENOUS | Status: DC | PRN

## 2023-12-31 MED ORDER — OXYCODONE HCL 5 MG PO TABS
ORAL_TABLET | ORAL | Status: AC
  Filled 2023-12-31: qty 1

## 2023-12-31 MED ORDER — MIDAZOLAM HCL 2 MG/2ML IJ SOLN
INTRAMUSCULAR | Status: AC
  Filled 2023-12-31: qty 2

## 2023-12-31 MED ORDER — POTASSIUM CHLORIDE 10 MEQ/100ML IV SOLN
10.0000 meq | INTRAVENOUS | Status: AC
  Administered 2023-12-31: 10 meq via INTRAVENOUS
  Filled 2023-12-31: qty 100

## 2023-12-31 MED ORDER — OXYCODONE HCL 5 MG PO TABS
5.0000 mg | ORAL_TABLET | ORAL | Status: DC | PRN
  Administered 2023-12-31: 5 mg via ORAL
  Administered 2023-12-31 – 2024-01-01 (×2): 10 mg via ORAL
  Filled 2023-12-31: qty 1
  Filled 2023-12-31 (×2): qty 2

## 2023-12-31 SURGICAL SUPPLY — 28 items
DERMABOND ADVANCED .7 DNX12 (GAUZE/BANDAGES/DRESSINGS) ×1 IMPLANT
DRAPE SURG IRRIG POUCH 19X23 (DRAPES) ×1 IMPLANT
DRSG OPSITE POSTOP 3X4 (GAUZE/BANDAGES/DRESSINGS) IMPLANT
DURAPREP 26ML APPLICATOR (WOUND CARE) ×1 IMPLANT
ELECTRODE REM PT RTRN 9FT ADLT (ELECTROSURGICAL) IMPLANT
GLOVE BIOGEL PI IND STRL 6.5 (GLOVE) ×2 IMPLANT
GLOVE BIOGEL PI IND STRL 7.0 (GLOVE) ×2 IMPLANT
GLOVE SURG SS PI 6.5 STRL IVOR (GLOVE) ×1 IMPLANT
GOWN STRL REUS W/ TWL LRG LVL3 (GOWN DISPOSABLE) ×2 IMPLANT
IRRIGATION SUCT STRKRFLW 2 WTP (MISCELLANEOUS) IMPLANT
KIT PINK PAD W/HEAD ARE REST (MISCELLANEOUS) ×1 IMPLANT
KIT PINK PAD W/HEAD ARM REST (MISCELLANEOUS) ×1 IMPLANT
KIT TURNOVER KIT B (KITS) ×1 IMPLANT
NS IRRIG 1000ML POUR BTL (IV SOLUTION) ×1 IMPLANT
PACK LAPAROSCOPY BASIN (CUSTOM PROCEDURE TRAY) ×1 IMPLANT
SET TUBE SMOKE EVAC HIGH FLOW (TUBING) ×1 IMPLANT
SHEARS HARMONIC ACE PLUS 36CM (ENDOMECHANICALS) IMPLANT
SLEEVE Z-THREAD 5X100MM (TROCAR) ×1 IMPLANT
SUT MNCRL AB 4-0 PS2 18 (SUTURE) ×1 IMPLANT
SUT VIC AB 4-0 PS2 18 (SUTURE) IMPLANT
SUT VICRYL 0 UR6 27IN ABS (SUTURE) ×2 IMPLANT
SYSTEM BAG RETRIEVAL 10MM (BASKET) IMPLANT
TOWEL GREEN STERILE FF (TOWEL DISPOSABLE) ×2 IMPLANT
TRAY FOLEY W/BAG SLVR 14FR (SET/KITS/TRAYS/PACK) ×1 IMPLANT
TROCAR 11X100 Z THREAD (TROCAR) IMPLANT
TROCAR BALLN 12MMX100 BLUNT (TROCAR) ×1 IMPLANT
TROCAR XCEL NON-BLD 5MMX100MML (ENDOMECHANICALS) ×1 IMPLANT
TROCAR Z-THREAD FIOS 5X100MM (TROCAR) IMPLANT

## 2023-12-31 NOTE — Op Note (Signed)
 Katrina Barnett PROCEDURE DATE: 12/31/2023  PREOPERATIVE DIAGNOSIS: Ruptured ectopic pregnancy POSTOPERATIVE DIAGNOSIS: Ruptured right fallopian tube ectopic pregnancy PROCEDURE: Laparoscopic right salpingectomy and removal of ectopic pregnancy SURGEON:  Dr. Verlyn Goad ASSISTANT: none ANESTHESIOLOGIST: Jonne Netters, MD Anesthesiologist: Neko Boyajian Bowens, MD; Jonne Netters, MD CRNA: Ezzie Holstein, CRNA; Niemzyk, Haiyun, CRNA  INDICATIONS: 23 y.o. [phone] at Unknown here for with ruptured ectopic pregnancy. On exam, she had stable vital signs, and an acute abdomen. Hgb  Lab Results  Component Value Date   HGB 12.4 12/31/2023   , blood type B POS . Patient was counseled regarding need for laparoscopic salpingectomy. Risks of surgery including bleeding which may require transfusion or reoperation, infection, injury to bowel or other surrounding organs, need for additional procedures including laparotomy and other postoperative/anesthesia complications were explained to patient.  Written informed consent was obtained.  FINDINGS:  A large amount of hemoperitoneum estimated to be about 1500cc of blood and clots.  Dilated right fallopian tube containing ectopic gestation. Small normal appearing uterus, normal left fallopian tube, right ovary and left ovary.  ANESTHESIA: General INTRAVENOUS FLUIDS: .1500 ml LR and 500 cc albumin ESTIMATED BLOOD LOSS:  20 mL  URINE OUTPUT: 150 ml SPECIMENS: right fallopian tube containing ectopic gestation COMPLICATIONS: None immediate  PROCEDURE IN DETAIL:  The patient was taken to the operating room where general anesthesia was administered and was found to be adequate.  She was placed in the dorsal lithotomy position, and was prepped and draped in a sterile manner.  A Foley catheter was inserted into her bladder and attached to Relda Agosto drainage and a uterine manipulator was then advanced into the uterus .  After an adequate timeout was performed,  attention was then turned to the patient's abdomen where a 10-mm skin incision was made on the umbilical fold.  The fascia was identified, tented up and incised with Mayo scissors. The fascia was tagged with 0- Vicryl. The peritoneum was identified tented up and entered sharply with Metzenbaum scissors.  10-mm trocar and sleeve were then advanced without difficulty into the abdomen where intraabdominal placement was confirmed by the laparoscope. Pneumoperitoneum was achieved with insufflation of CO2 gas. A survey of the patient's pelvis and abdomen revealed the findings as above.  Two 5-mm left lower quadrant ports were placed under direct visualization.  The 5-mm Nezhat suction irrigator was then used to suction the hemoperitoneum and irrigate the pelvis.  Attention was then turned to the right fallopian tube which was grasped and ligated from the underlying mesosalpinx and uterine attachment using the Harmonic instrument.  Good hemostasis was noted.  The specimen was placed in an EndoCatch bag and removed from the abdomen intact.  The abdomen was desufflated, and all instruments were removed.  The fascial incisions at the 10-mm site was reapproximated with 0 Vicryl figure-of-eight stiches; and all skin incisions were closed with a 3-0 Vicryl subcuticular stitch. The patient tolerated the procedures well.  All instruments, needles, and sponge counts were correct x 2. The patient was taken to the recovery room in stable condition.
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
## 2023-12-31 NOTE — Transfer of Care (Signed)
 Immediate Anesthesia Transfer of Care Note  Patient: Katrina Barnett  Procedure(s) Performed: SALPINGECTOMY, UNILATERAL, LAPAROSCOPIC  Patient Location: PACU  Anesthesia Type:General  Level of Consciousness: awake, alert , oriented, and patient cooperative  Airway & Oxygen Therapy: Patient Spontanous Breathing  Post-op Assessment: Report given to RN and Post -op Vital signs reviewed and stable  Post vital signs: Reviewed and stable  Last Vitals:  Vitals Value Taken Time  BP    Temp    Pulse    Resp    SpO2      Last Pain:  Vitals:   12/31/23 0714  TempSrc: Oral  PainSc:          Complications: No notable events documented.
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
## 2023-12-31 NOTE — ED Triage Notes (Signed)
 BIB [facility] with lower abd pain. Severe abd pain 10/10. Pt states that it hurt when she urinated and then started cramping. Pt states that she noticed spotting of blood when she wiped after urination. Pt states that she took a home preg test and it was positive- LMP 3.31.25. Pt received 250 mL of NS and 4 mg Zofran . 22 g LAC. Soft pressures en route.

## 2023-12-31 NOTE — Progress Notes (Signed)
*   Day of Surgery * Procedure(s) (LRB): SALPINGECTOMY, UNILATERAL, LAPAROSCOPIC (N/A)  Subjective: Patient reports incisional pain and tolerating PO.    Objective: I have reviewed patient's vital signs, intake and output, and labs. Blood pressure (!) 99/57, pulse 70, temperature (!) 96.7 F (35.9 C), resp. rate 19, height 5\' 3"  (1.6 m), weight 52.2 kg, last menstrual period 11/17/2023, SpO2 100%, unknown if currently breastfeeding.  General: alert, cooperative, and no distress Resp: effort normal Cardio: regular rate and rhythm CBC    Component Value Date/Time   WBC 14.5 (H) 12/31/2023 1000   RBC 2.03 (L) 12/31/2023 1000   HGB 6.4 (LL) 12/31/2023 1000   HGB 11.4 07/19/2022 0839   HCT 19.0 (L) 12/31/2023 1000   HCT 33.7 (L) 07/19/2022 0839   PLT 187 12/31/2023 1000   PLT 270 07/19/2022 0839   MCV 93.6 12/31/2023 1000   MCV 92 07/19/2022 0839   MCH 31.5 12/31/2023 1000   MCHC 33.7 12/31/2023 1000   RDW 12.2 12/31/2023 1000   RDW 12.1 07/19/2022 0839   LYMPHSABS 2.8 12/31/2023 0454   LYMPHSABS 1.6 07/03/2022 1058   MONOABS 0.6 12/31/2023 0454   EOSABS 0.1 12/31/2023 0454   EOSABS 0.1 07/03/2022 1058   BASOSABS 0.0 12/31/2023 0454   BASOSABS 0.0 07/03/2022 1058    Assessment: s/p Procedure(s): SALPINGECTOMY, UNILATERAL, LAPAROSCOPIC (N/A): stable and anemia  Plan: Advance diet Observation and transfusion 2 units PRBC  LOS: 0 days    Katrina Bilis, MD 12/31/2023, 12:25 PM

## 2023-12-31 NOTE — Anesthesia Procedure Notes (Signed)
 Procedure Name: Intubation Date/Time: 12/31/2023 7:32 AM  Performed by: Ezzie Holstein, CRNAPre-anesthesia Checklist: Patient identified, Emergency Drugs available, Suction available and Patient being monitored Patient Re-evaluated:Patient Re-evaluated prior to induction Oxygen Delivery Method: Circle System Utilized Preoxygenation: Pre-oxygenation with 100% oxygen Induction Type: IV induction Ventilation: Mask ventilation without difficulty Laryngoscope Size: Mac and 3 Grade View: Grade I Tube type: Oral Tube size: 7.0 mm Number of attempts: 1 Airway Equipment and Method: Stylet Placement Confirmation: ETT inserted through vocal cords under direct vision, positive ETCO2 and breath sounds checked- equal and bilateral Secured at: 22 cm Tube secured with: Tape Dental Injury: Teeth and Oropharynx as per pre-operative assessment

## 2023-12-31 NOTE — Anesthesia Postprocedure Evaluation (Signed)
 Anesthesia Post Note  Patient: Katrina Barnett  Procedure(s) Performed: SALPINGECTOMY, UNILATERAL, LAPAROSCOPIC     Patient location during evaluation: PACU Anesthesia Type: General Level of consciousness: awake and alert, patient cooperative and oriented Pain management: pain level controlled Vital Signs Assessment: post-procedure vital signs reviewed and stable Respiratory status: spontaneous breathing, nonlabored ventilation and respiratory function stable Cardiovascular status: blood pressure returned to baseline and stable Postop Assessment: no apparent nausea or vomiting and able to ambulate Anesthetic complications: no   No notable events documented.  Last Vitals:  Vitals:   12/31/23 1030 12/31/23 1045  BP: (!) 97/48 (!) 99/56  Pulse: 77 68  Resp: 16 17  Temp:    SpO2: 100% 100%    Last Pain:  Vitals:   12/31/23 1015  TempSrc:   PainSc: 0-No pain                 [name],E. Leiana Rund

## 2023-12-31 NOTE — Anesthesia Preprocedure Evaluation (Signed)
 Anesthesia Evaluation  Patient identified by MRN, date of birth, ID band Patient awake    Reviewed: Allergy & Precautions, NPO status , Patient's Chart, lab work & pertinent test results  Airway Mallampati: I  TM Distance: >3 FB Neck ROM: Full    Dental  (+) Dental Advisory Given   Pulmonary neg pulmonary ROS   breath sounds clear to auscultation       Cardiovascular negative cardio ROS  Rhythm:Regular Rate:Normal     Neuro/Psych negative neurological ROS     GI/Hepatic negative GI ROS, Neg liver ROS,,,  Endo/Other  negative endocrine ROS    Renal/GU negative Renal ROS     Musculoskeletal   Abdominal   Peds  Hematology negative hematology ROS (+)   Anesthesia Other Findings   Reproductive/Obstetrics Ruptured ectopic pregnancy                              Anesthesia Physical Anesthesia Plan  ASA: 1 and emergent  Anesthesia Plan: General   Post-op Pain Management: Ofirmev  IV (intra-op)*   Induction: Intravenous  PONV Risk Score and Plan: 3 and Dexamethasone , Ondansetron  and Treatment may vary due to age or medical condition  Airway Management Planned: Oral ETT  Additional Equipment:   Intra-op Plan:   Post-operative Plan: Extubation in OR  Informed Consent: I have reviewed the patients History and Physical, chart, labs and discussed the procedure including the risks, benefits and alternatives for the proposed anesthesia with the patient or authorized representative who has indicated his/her understanding and acceptance.     Dental advisory given  Plan Discussed with: CRNA  Anesthesia Plan Comments:        Anesthesia Quick Evaluation

## 2024-01-01 ENCOUNTER — Other Ambulatory Visit (HOSPITAL_COMMUNITY): Payer: Self-pay

## 2024-01-01 ENCOUNTER — Encounter (HOSPITAL_COMMUNITY): Payer: Self-pay | Admitting: Obstetrics and Gynecology

## 2024-01-01 ENCOUNTER — Ambulatory Visit: Payer: Self-pay | Admitting: Obstetrics & Gynecology

## 2024-01-01 LAB — SURGICAL PATHOLOGY

## 2024-01-01 MED ORDER — IBUPROFEN 600 MG PO TABS
600.0000 mg | ORAL_TABLET | Freq: Four times a day (QID) | ORAL | 0 refills | Status: AC
Start: 2024-01-01 — End: ?
  Filled 2024-01-01: qty 30, 8d supply, fill #0

## 2024-01-01 MED ORDER — OXYCODONE HCL 5 MG PO TABS
5.0000 mg | ORAL_TABLET | ORAL | 0 refills | Status: AC | PRN
Start: 2024-01-01 — End: ?
  Filled 2024-01-01: qty 12, 2d supply, fill #0

## 2024-01-01 MED ORDER — ONDANSETRON 8 MG PO TBDP
8.0000 mg | ORAL_TABLET | Freq: Three times a day (TID) | ORAL | 0 refills | Status: AC | PRN
  Filled 2024-01-01: qty 8, 3d supply, fill #0

## 2024-01-01 NOTE — Discharge Summary (Signed)
 Physician Discharge Summary  Patient ID: Katrina Barnett MRN: [id number] DOB/AGE: [birth year] 23 y.o.  Admit date: 12/31/2023 Discharge date: 01/01/2024  Admission Diagnoses:  Discharge Diagnoses:  Principal Problem:   Ruptured left tubal ectopic pregnancy causing hemoperitoneum Active Problems:   Ruptured ectopic pregnancy   Discharged Condition: good  Hospital Course: surgery per op note Post op course uncomplicated  Consults: None  Significant Diagnostic Studies: sonogram  Treatments: surgery: laparoscopic right salpingectomy  Discharge Exam: Blood pressure (!) 97/53, pulse 68, temperature 98.2 F (36.8 C), temperature source Oral, resp. rate 17, height 5\' 3"  (1.6 m), weight 52.2 kg, last menstrual period 11/17/2023, SpO2 100%, unknown if currently breastfeeding. General appearance: alert, cooperative, and no distress GI: soft, non-tender; bowel sounds normal; no masses,  no organomegaly Incision/Wound:clean dry intact  Disposition: Discharge disposition: 01-Home or Self Care       Discharge Instructions     Call MD for:  persistant nausea and vomiting   Complete by: As directed    Call MD for:  severe uncontrolled pain   Complete by: As directed    Call MD for:  temperature >100.4   Complete by: As directed    Diet - low sodium heart healthy   Complete by: As directed    Driving Restrictions   Complete by: As directed    No driving for 3 days   Increase activity slowly   Complete by: As directed    Lifting restrictions   Complete by: As directed    Do not lift more than 20 pounds for 2 weeks   No wound care   Complete by: As directed    Sexual Activity Restrictions   Complete by: As directed    No sex 2 weeks      Allergies as of 01/01/2024   No Known Allergies      Medication List     TAKE these medications    ibuprofen  600 MG tablet Commonly known as: ADVIL  Take 1 tablet (600 mg total) by mouth every 6 (six) hours.   ondansetron  8 MG  disintegrating tablet Commonly known as: ZOFRAN -ODT Take 1 tablet (8 mg total) by mouth every 8 (eight) hours as needed for nausea or vomiting.   oxyCODONE  5 MG immediate release tablet Commonly known as: Oxy IR/ROXICODONE  Take 1-2 tablets (5-10 mg total) by mouth every 4 (four) hours as needed for moderate pain (pain score 4-6).        Follow-up Information     [facility] Follow up.   Why: for post op check in 2 weeks Contact information: [address] [place]  [address] [phone]                Signed: Wendelyn Halter 01/01/2024, 2:49 PM

## 2024-01-01 NOTE — Plan of Care (Signed)

## 2024-01-04 LAB — BPAM RBC
Blood Product Expiration Date: 202506052359
Blood Product Expiration Date: 202506082359
ISSUE DATE / TIME: 202505131210
ISSUE DATE / TIME: 202505141256
Unit Type and Rh: 7300
Unit Type and Rh: 7300

## 2024-01-04 LAB — TYPE AND SCREEN
ABO/RH(D): B POS
Antibody Screen: NEGATIVE
Unit division: 0
Unit division: 0

## 2024-01-13 ENCOUNTER — Encounter: Admitting: Obstetrics and Gynecology

## 2024-01-16 ENCOUNTER — Encounter (HOSPITAL_COMMUNITY): Payer: Self-pay | Admitting: Obstetrics and Gynecology

## 2024-06-14 ENCOUNTER — Ambulatory Visit
Admission: EM | Admit: 2024-06-14 | Discharge: 2024-06-14 | Disposition: A | Discharge status: Home / Self Care | Attending: Family Medicine | Admitting: Family Medicine

## 2024-06-14 ENCOUNTER — Other Ambulatory Visit: Payer: Self-pay

## 2024-06-14 DIAGNOSIS — J0110 Acute frontal sinusitis, unspecified: Secondary | ICD-10-CM

## 2024-06-14 MED ORDER — FLUTICASONE PROPIONATE 50 MCG/ACT NA SUSP: 1.0000 | Freq: Every day | NASAL | 0 refills | Status: AC

## 2024-06-14 MED ORDER — IBUPROFEN 600 MG PO TABS
600.0000 mg | ORAL_TABLET | Freq: Once | ORAL | Status: AC
Start: 2024-06-14 — End: 2024-06-14
  Administered 2024-06-14: 600 mg via ORAL

## 2024-06-14 MED ORDER — AMOXICILLIN 875 MG PO TABS: 875.0000 mg | ORAL_TABLET | Freq: Two times a day (BID) | ORAL | 0 refills | Status: AC

## 2024-06-14 MED ORDER — PREDNISONE 20 MG PO TABS: 20.0000 mg | ORAL_TABLET | Freq: Every day | ORAL | 0 refills | Status: AC
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
## 2024-06-14 NOTE — ED Triage Notes (Signed)
 Pt c/o nasal congestion on right nostril, right nasal pain, swelling on right side nose and right sided Hax4d. Pt has tried OTC meds, but only the Advil  has helped

## 2024-06-14 NOTE — Discharge Instructions (Signed)
 Start amoxicillin twice daily for 10 days.  Start Flonase daily.  You may take prednisone daily.  Do not breast-feed for 4 hours after taking the prednisone.  Lots of rest and fluids.  Please follow-up with your PCP if your symptoms do not improve.  Please go to the ER for any worsening symptoms.  Hope you feel better soon!

## 2024-09-21 ENCOUNTER — Emergency Department (HOSPITAL_COMMUNITY)
Admission: EM | Admit: 2024-09-21 | Discharge: 2024-09-21 | Disposition: A | Discharge status: Home / Self Care | Attending: Emergency Medicine | Admitting: Emergency Medicine

## 2024-09-21 ENCOUNTER — Emergency Department (HOSPITAL_COMMUNITY)

## 2024-09-21 ENCOUNTER — Other Ambulatory Visit: Payer: Self-pay

## 2024-09-21 ENCOUNTER — Encounter (HOSPITAL_COMMUNITY): Payer: Self-pay | Admitting: Emergency Medicine

## 2024-09-21 DIAGNOSIS — S43004A Unspecified dislocation of right shoulder joint, initial encounter: Secondary | ICD-10-CM

## 2024-09-21 DIAGNOSIS — X58XXXA Exposure to other specified factors, initial encounter: Secondary | ICD-10-CM | POA: Insufficient documentation

## 2024-09-21 DIAGNOSIS — S43014A Anterior dislocation of right humerus, initial encounter: Secondary | ICD-10-CM | POA: Insufficient documentation

## 2024-09-21 MED ORDER — PROPOFOL 10 MG/ML IV BOLUS
0.5000 mg/kg | Freq: Once | INTRAVENOUS | Status: DC
  Filled 2024-09-21: qty 20

## 2024-09-21 MED ORDER — HYDROMORPHONE HCL 1 MG/ML IJ SOLN
0.5000 mg | Freq: Once | INTRAMUSCULAR | Status: AC
  Administered 2024-09-21: 0.5 mg via INTRAVENOUS
  Filled 2024-09-21: qty 1

## 2024-09-21 MED ORDER — PROPOFOL 10 MG/ML IV BOLUS
INTRAVENOUS | Status: AC | PRN
  Administered 2024-09-21: 30 mg via INTRAVENOUS

## 2024-09-21 NOTE — ED Triage Notes (Signed)
 Pt arrives w/ [facility] w/ c/o right shoulder dislocation. Pt woke up w/ arm pain. Hx dislocations.  150mcg fentanyl  given en route  20G LAC VSS
